# Patient Record
Sex: Female | Born: 1945
Health system: Southern US, Community
[De-identification: ages and names within clinical notes are randomized; demographics above are authoritative.]

## PROBLEM LIST (undated history)

## (undated) DIAGNOSIS — T7840XA Allergy, unspecified, initial encounter: Secondary | ICD-10-CM

## (undated) DIAGNOSIS — M1990 Unspecified osteoarthritis, unspecified site: Secondary | ICD-10-CM

## (undated) DIAGNOSIS — H409 Unspecified glaucoma: Secondary | ICD-10-CM

## (undated) HISTORY — PX: TONSILLECTOMY: SUR1361

## (undated) HISTORY — DX: Allergy, unspecified, initial encounter: T78.40XA

## (undated) HISTORY — DX: Unspecified osteoarthritis, unspecified site: M19.90

## (undated) HISTORY — DX: Unspecified glaucoma: H40.9

## (undated) HISTORY — PX: COSMETIC SURGERY: SHX468

---

## 1998-11-11 ENCOUNTER — Other Ambulatory Visit: Admission: RE | Admit: 1998-11-11 | Discharge: 1998-11-11 | Payer: Self-pay | Admitting: Gynecology

## 1999-09-27 ENCOUNTER — Other Ambulatory Visit: Admission: RE | Admit: 1999-09-27 | Discharge: 1999-09-27 | Payer: Self-pay | Admitting: Gynecology

## 1999-09-27 ENCOUNTER — Encounter (INDEPENDENT_AMBULATORY_CARE_PROVIDER_SITE_OTHER): Payer: Self-pay

## 1999-12-22 ENCOUNTER — Ambulatory Visit (HOSPITAL_COMMUNITY): Admission: RE | Admit: 1999-12-22 | Discharge: 1999-12-22 | Payer: Self-pay | Admitting: Gastroenterology

## 2000-01-18 ENCOUNTER — Encounter: Payer: Self-pay | Admitting: Gynecology

## 2000-01-18 ENCOUNTER — Encounter: Admission: RE | Admit: 2000-01-18 | Discharge: 2000-01-18 | Payer: Self-pay | Admitting: Gynecology

## 2001-02-05 ENCOUNTER — Encounter: Payer: Self-pay | Admitting: Gynecology

## 2001-02-05 ENCOUNTER — Encounter: Admission: RE | Admit: 2001-02-05 | Discharge: 2001-02-05 | Payer: Self-pay | Admitting: Gynecology

## 2002-11-06 ENCOUNTER — Encounter: Payer: Self-pay | Admitting: Family Medicine

## 2002-11-06 ENCOUNTER — Encounter: Admission: RE | Admit: 2002-11-06 | Discharge: 2002-11-06 | Payer: Self-pay | Admitting: Gynecology

## 2002-11-12 ENCOUNTER — Other Ambulatory Visit: Admission: RE | Admit: 2002-11-12 | Discharge: 2002-11-12 | Payer: Self-pay | Admitting: Family Medicine

## 2004-03-15 ENCOUNTER — Encounter: Admission: RE | Admit: 2004-03-15 | Discharge: 2004-03-15 | Payer: Self-pay | Admitting: Family Medicine

## 2004-12-16 ENCOUNTER — Encounter: Admission: RE | Admit: 2004-12-16 | Discharge: 2004-12-16 | Payer: Self-pay | Admitting: Family Medicine

## 2004-12-28 ENCOUNTER — Encounter: Admission: RE | Admit: 2004-12-28 | Discharge: 2004-12-28 | Payer: Self-pay | Admitting: Family Medicine

## 2005-05-02 ENCOUNTER — Encounter: Admission: RE | Admit: 2005-05-02 | Discharge: 2005-05-02 | Payer: Self-pay | Admitting: Family Medicine

## 2005-12-11 ENCOUNTER — Other Ambulatory Visit: Admission: RE | Admit: 2005-12-11 | Discharge: 2005-12-11 | Payer: Self-pay | Admitting: *Deleted

## 2006-05-03 ENCOUNTER — Encounter: Admission: RE | Admit: 2006-05-03 | Discharge: 2006-05-03 | Payer: Self-pay | Admitting: Family Medicine

## 2007-05-23 ENCOUNTER — Encounter: Admission: RE | Admit: 2007-05-23 | Discharge: 2007-05-23 | Payer: Self-pay | Admitting: Family Medicine

## 2007-07-29 ENCOUNTER — Other Ambulatory Visit: Admission: RE | Admit: 2007-07-29 | Discharge: 2007-07-29 | Payer: Self-pay | Admitting: Family Medicine

## 2007-11-12 ENCOUNTER — Encounter: Admission: RE | Admit: 2007-11-12 | Discharge: 2007-11-12 | Payer: Self-pay | Admitting: Family Medicine

## 2007-12-17 ENCOUNTER — Encounter: Admission: RE | Admit: 2007-12-17 | Discharge: 2007-12-17 | Payer: Self-pay | Admitting: Family Medicine

## 2007-12-17 ENCOUNTER — Encounter (INDEPENDENT_AMBULATORY_CARE_PROVIDER_SITE_OTHER): Payer: Self-pay | Admitting: Interventional Radiology

## 2007-12-17 ENCOUNTER — Other Ambulatory Visit: Admission: RE | Admit: 2007-12-17 | Discharge: 2007-12-17 | Payer: Self-pay | Admitting: Interventional Radiology

## 2008-01-03 ENCOUNTER — Ambulatory Visit: Payer: Self-pay | Admitting: Vascular Surgery

## 2008-06-02 ENCOUNTER — Encounter: Admission: RE | Admit: 2008-06-02 | Discharge: 2008-06-02 | Payer: Self-pay | Admitting: Family Medicine

## 2009-05-28 ENCOUNTER — Encounter: Admission: RE | Admit: 2009-05-28 | Discharge: 2009-05-28 | Payer: Self-pay | Admitting: Family Medicine

## 2009-06-01 ENCOUNTER — Ambulatory Visit (HOSPITAL_COMMUNITY): Admission: RE | Admit: 2009-06-01 | Discharge: 2009-06-01 | Payer: Self-pay | Admitting: Family Medicine

## 2009-06-18 ENCOUNTER — Encounter: Admission: RE | Admit: 2009-06-18 | Discharge: 2009-06-18 | Payer: Self-pay | Admitting: Family Medicine

## 2009-07-13 ENCOUNTER — Ambulatory Visit (HOSPITAL_BASED_OUTPATIENT_CLINIC_OR_DEPARTMENT_OTHER): Admission: RE | Admit: 2009-07-13 | Discharge: 2009-07-13 | Payer: Self-pay | Admitting: Gynecology

## 2009-12-08 ENCOUNTER — Emergency Department (HOSPITAL_COMMUNITY): Admission: EM | Admit: 2009-12-08 | Discharge: 2009-12-08 | Payer: Self-pay | Admitting: Emergency Medicine

## 2010-05-30 ENCOUNTER — Encounter: Payer: Self-pay | Admitting: Family Medicine

## 2010-07-15 ENCOUNTER — Other Ambulatory Visit: Payer: Self-pay | Admitting: Family Medicine

## 2010-07-15 DIAGNOSIS — Z1231 Encounter for screening mammogram for malignant neoplasm of breast: Secondary | ICD-10-CM

## 2010-07-25 ENCOUNTER — Ambulatory Visit
Admission: RE | Admit: 2010-07-25 | Discharge: 2010-07-25 | Disposition: A | Discharge status: Home / Self Care | Payer: 59 | Source: Ambulatory Visit | Attending: Family Medicine | Admitting: Family Medicine

## 2010-07-25 DIAGNOSIS — Z1231 Encounter for screening mammogram for malignant neoplasm of breast: Secondary | ICD-10-CM

## 2010-08-01 LAB — POCT HEMOGLOBIN-HEMACUE: Hemoglobin: 12.9 g/dL (ref 12.0–15.0)

## 2010-09-20 NOTE — Consult Note (Signed)
NEW PATIENT CONSULTATION   Maria Hurst, Maria Hurst  DOB:  Nov 27, 1945                                       01/03/2008  ZOXWR#:[id number]   The patient presents today for evaluation of extracranial  cerebrovascular occlusive disease.  She is a very pleasant 65 year old  white female who had an episode this year of severe headache and  tingling in her left arm.  She had undergone workup of this to include a  CAT scan of her head, which was negative, an echocardiogram, which  showed minimal stage I diastolic dysfunction and mild aortic sclerosis,  and also underwent carotid duplex, which shows no evidence of carotid  stenosis, but did show bilateral carotid plaque, left greater than  right.  She reports that she has had a significant amount of stress on  her job and resulting in a severe headache, and did report some tingling  around the same time, although now she does not relate it specifically  to a headache.  She denies any weakness, and simply reported a tingling  sensation in her arm, as though she had slept on it wrong.  She  specifically denies any prior amaurosis fugax, transient ischemic  attack, or stroke.   PAST HISTORY:  Significant for elevated blood pressure and elevated  cholesterol, which are controlled with medications.  She denies any  cardiac difficulty.  She is married.  She has 3 to 5 alcoholic drinks  per week.   REVIEW OF SYSTEMS:  Her weight is reported at 162 pounds.  She is 5 feet  4 inches tall.   MEDICATION ALLERGIES:  Sulfa.   CURRENT MEDICATIONS:  Simvastatin, timolol eye drops, baby aspirin, and  prednisone Dosepak for poison ivy on her right arm.   PHYSICAL EXAM:  Blood pressure is 142/88, pulse 69, respirations 18.  She is grossly intact neurologically.  Well-developed, well-nourished  white female in no acute distress.  Her carotid arteries are without  bruits bilaterally with normal pulsation.  She has 2+ radial and 2+  dorsalis  pedis pulses bilaterally.   I reviewed her carotid duplex.  I explained that this showed no stenosis  and plaque only in both carotid systems.  I explained that her left arm  symptoms do not sound typical of transient ischemic attack, and that  regardless, she has no significant plaque in her right carotid  bifurcation with more plaque in the left, which would not cause left-  sided symptoms.  I recommended observation only.  I would not repeat her  duplex unless she developed new symptoms.  She will see [place] again on an  as needed basis.   Larina Earthly, M.D.  Electronically Signed   TFE/MEDQ  D:  01/03/2008  T:  01/06/2008  Job:  [id number]   cc:   Dwana Curd. Para March, M.D.

## 2010-09-23 NOTE — Procedures (Signed)
[facility]  Patient:    Maria Hurst, Maria Hurst                      MRN: [id number] Proc. Date: 12/22/99 Adm. Date:  [id number] Disc. Date: [id number] Attending:  Rich Brave                           Procedure Report  PROCEDURE:  Colonoscopy with biopsies.  INDICATIONS FOR PROCEDURE:  A 65 year old female with intermittent diarrhea, for colon cancer screening.  FINDINGS:  Normal exam to the terminal ileum.  DESCRIPTION OF PROCEDURE:  The nature, purpose and risk of the procedure had been discussed with the patient who provided written consent. Sedation was fentanyl 100 mcg and Versed 10 mg IV prior to and during this procedure and the upper endoscopy which immediately preceded it.  Perianal inspection and digital exam to the rectum were unremarkable. The Olympus adult video colonoscope was easily advanced to the cecum and then with mild difficulty up into a normal appearing terminal ileum for a distance of about 10 or 15 cm. Pullback was then performed in a gradual fashion.  This was a normal examination. No cancer, polyps, masses, colitis, vascular malformations, or diverticular disease were appreciated. Because of the history of diarrhea, I did obtain random mucosal biopsies from the terminal ileum and from each section of the colon. One of these biopsies, in the transverse colonic area, bled rather freely and was associated with formation of the submucosal bled which was violatious in color.  This appeared to stop bleeding spontaneously following the biopsy and was watched for a few minutes until the blood flow had essentially completely stopped.  retroflexion in the rectum showed rather prominent internal hemorrhoids.  The patient tolerated the procedure well and there were no apparent complications.  IMPRESSION:  Normal colonoscopy without source of diarrhea endoscopically evident.  PLAN:  1. Await pathology on todays biopsies.  2. Consider a screening flexible sigmoidoscopy for colon cancer screening     in approximately another 5 years.  3. Follow-up in the office could be at the discretion of the patient     according to the histologic findings on todays biopsies. DD:  12/22/99 TD:  12/24/99 Job: [id number]

## 2010-09-23 NOTE — Procedures (Signed)
[facility]  Patient:    Maria Hurst, Maria Hurst                      MRN: [id number] Proc. Date: 12/22/99 Adm. Date:  [id number] Disc. Date: [id number] Attending:  Rich Brave CC:         Gretta Cool, M.D.

## 2010-11-14 ENCOUNTER — Other Ambulatory Visit: Payer: Self-pay | Admitting: Family Medicine

## 2010-11-14 ENCOUNTER — Other Ambulatory Visit (HOSPITAL_COMMUNITY)
Admission: RE | Admit: 2010-11-14 | Discharge: 2010-11-14 | Disposition: A | Discharge status: Home / Self Care | Payer: 59 | Source: Ambulatory Visit | Attending: Family Medicine | Admitting: Family Medicine

## 2010-11-14 DIAGNOSIS — Z124 Encounter for screening for malignant neoplasm of cervix: Secondary | ICD-10-CM | POA: Insufficient documentation

## 2010-11-14 DIAGNOSIS — Z1159 Encounter for screening for other viral diseases: Secondary | ICD-10-CM | POA: Insufficient documentation

## 2011-07-28 ENCOUNTER — Other Ambulatory Visit: Payer: Self-pay | Admitting: Family Medicine

## 2011-07-28 DIAGNOSIS — Z1231 Encounter for screening mammogram for malignant neoplasm of breast: Secondary | ICD-10-CM

## 2011-08-03 ENCOUNTER — Ambulatory Visit
Admission: RE | Admit: 2011-08-03 | Discharge: 2011-08-03 | Disposition: A | Discharge status: Home / Self Care | Payer: BC Managed Care – PPO | Source: Ambulatory Visit | Attending: Family Medicine | Admitting: Family Medicine

## 2011-08-03 DIAGNOSIS — Z1231 Encounter for screening mammogram for malignant neoplasm of breast: Secondary | ICD-10-CM

## 2011-12-19 IMAGING — CT CT ABD-PELV W/ CM
2 of 5 series · 17 of 46 positions shown, 19 images · IV contrast (agent unspecified)
Comparison: Ultrasound abdomen of 12/16/2004

CLINICAL DATA: Left lower quadrant pain, some nausea, question of
palpable mass for several weeks

CT ABDOMEN AND PELVIS WITH CONTRAST
TECHNIQUE: Multidetector CT imaging of the abdomen and pelvis was
performed following the standard protocol during bolus
administration of intravenous contrast.
Contrast: 100 ml [id number]

[Series 2: portal · axial · portal-venous · 0.72mm/px · z∈[+548,+953]mm · 14 of 93 slices shown, 16 images]
[im 6/93  soft-tissue]
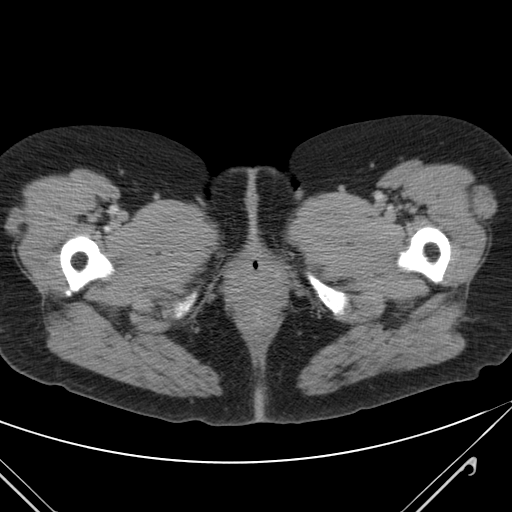
[im 6/93  bone]
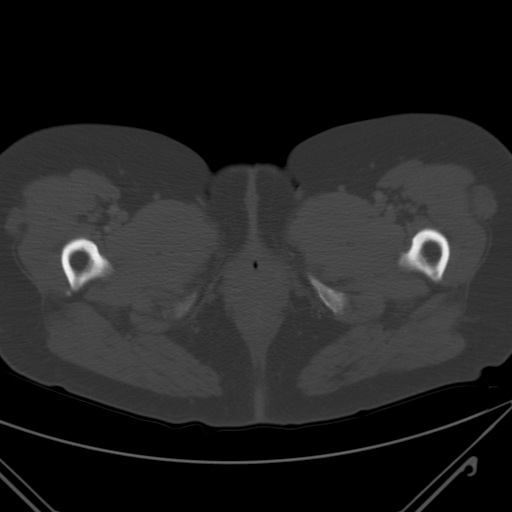
[im 11/93  soft-tissue]
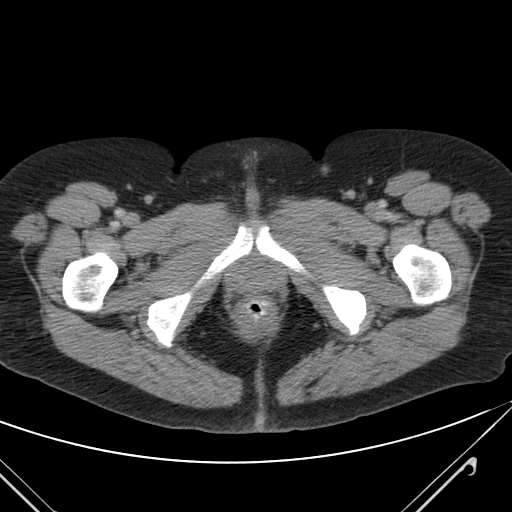
[im 17/93  soft-tissue]
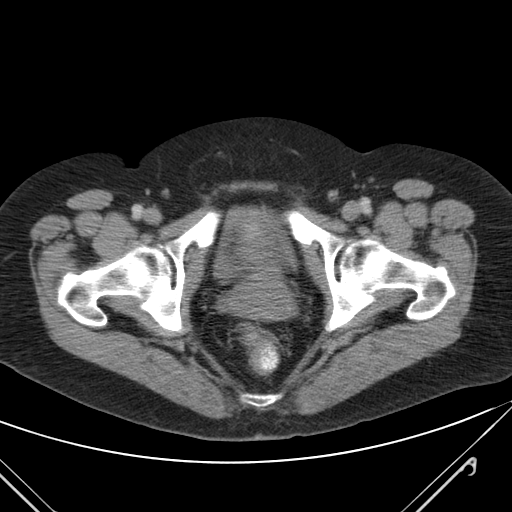
[im 28/93  soft-tissue]
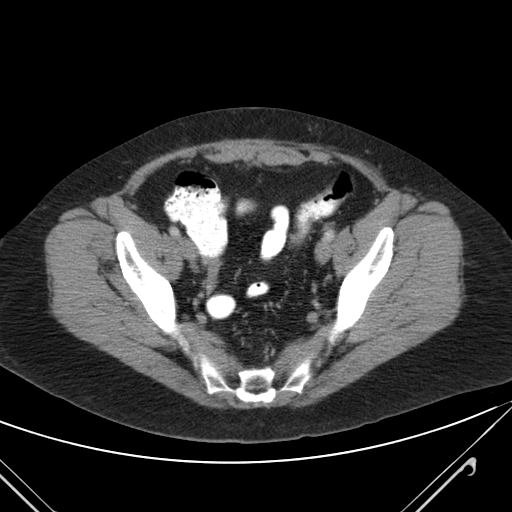
[im 33/93  soft-tissue]
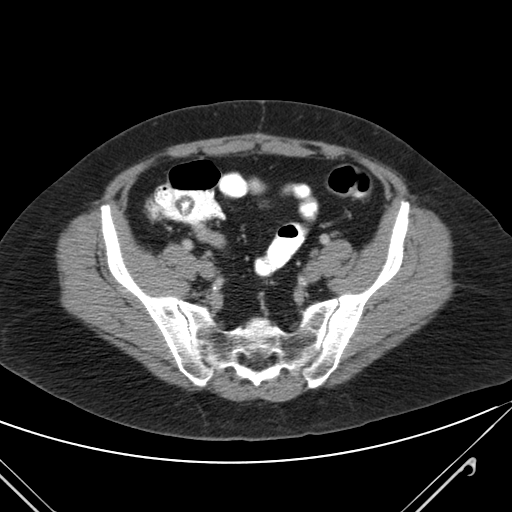
[im 38/93  soft-tissue]
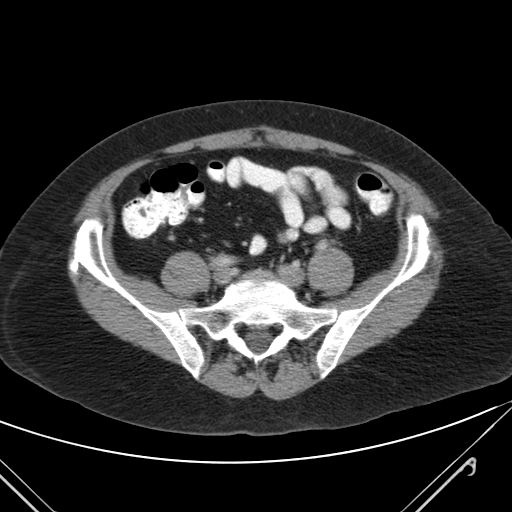
[im 44/93  soft-tissue]
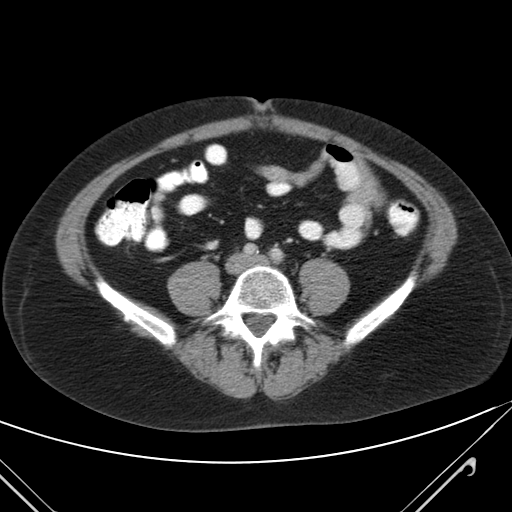
[im 49/93  soft-tissue]
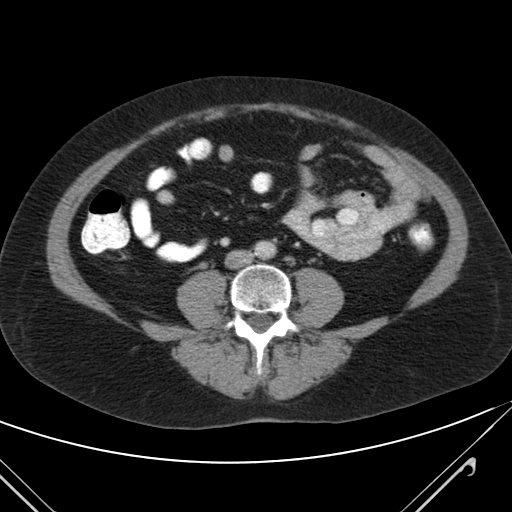
[im 55/93  soft-tissue]
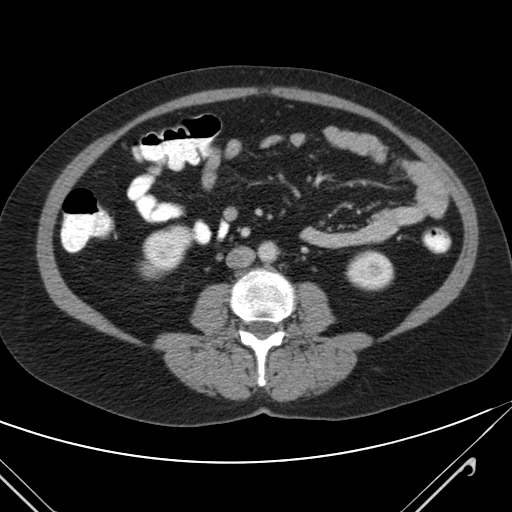
[im 55/93  bone]
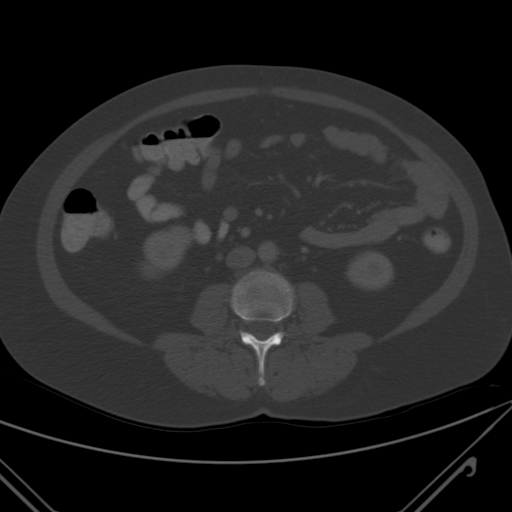
[im 60/93  soft-tissue]
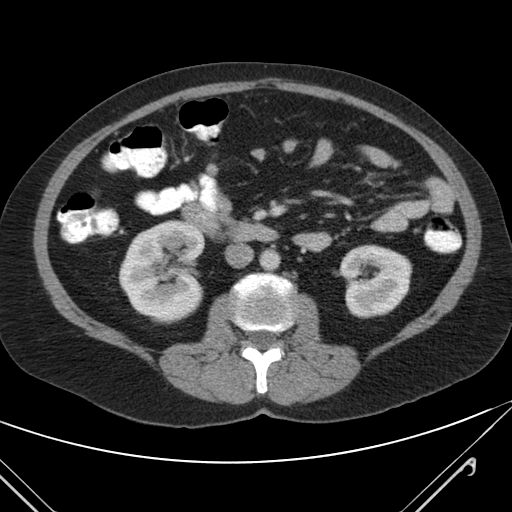
[im 71/93  soft-tissue]
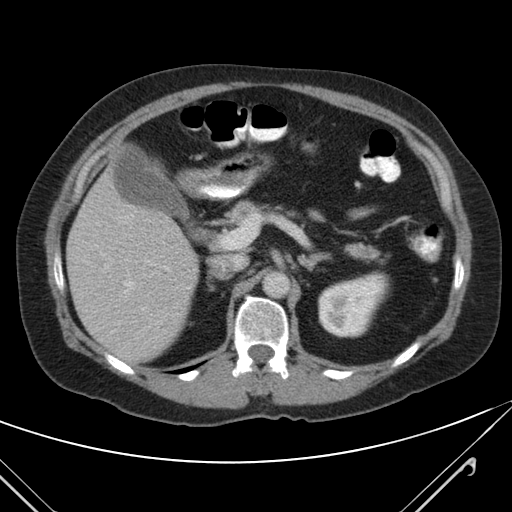
[im 76/93  soft-tissue]
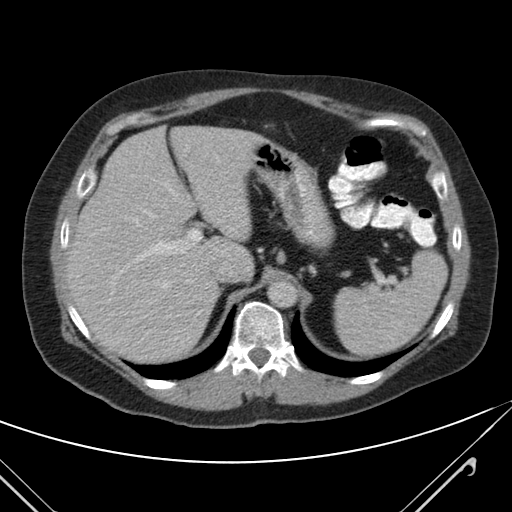
[im 82/93  soft-tissue]
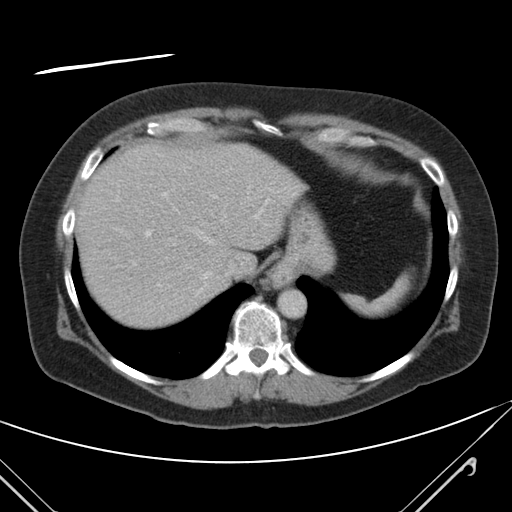
[im 87/93  soft-tissue]
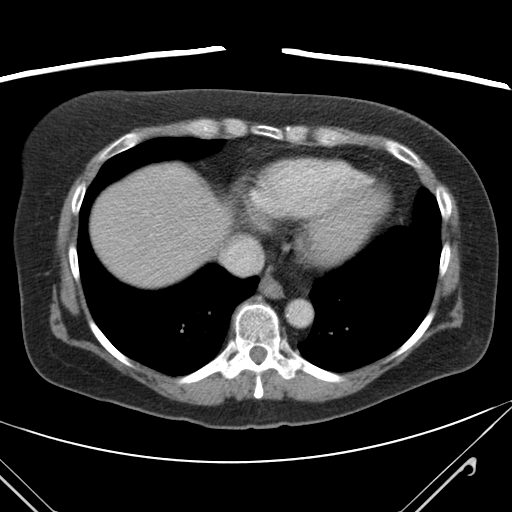

[Series 8040: coronals · coronal · 0.90mm/px · 3 of 81 slices shown]
[im 27/81  soft-tissue]
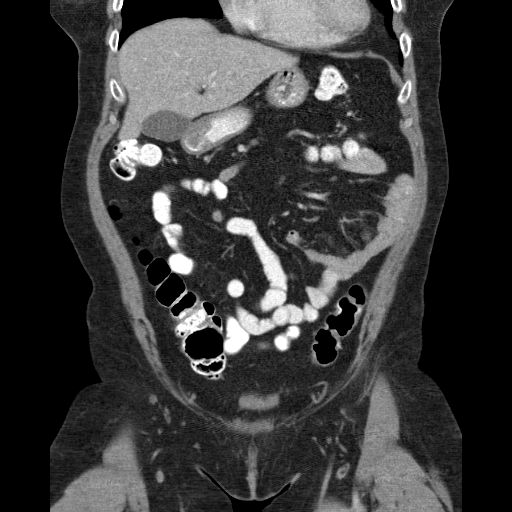
[im 36/81  soft-tissue]
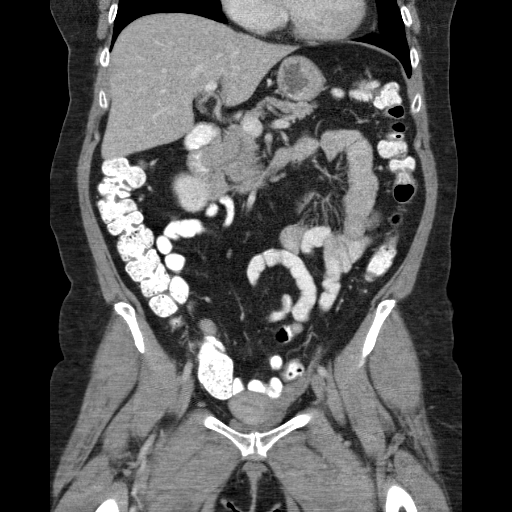
[im 45/81  soft-tissue]
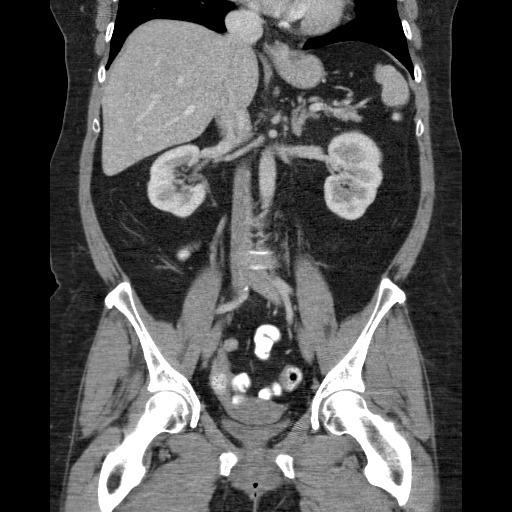

[17 of 46 positions shown; findings below may reference images not displayed]

FINDINGS: The lung bases are clear.  There does appear to be a
small hiatal hernia present.  The liver enhances with no focal
abnormality and no ductal dilatation is seen.  No calcified
gallstones are noted.  The pancreas is normal in size and the
pancreatic duct is not dilated.  The adrenal glands and spleen are
unremarkable.  The stomach is not well distended.  The kidneys
enhance with no calculus or mass and no hydronephrosis is seen.
The abdominal aorta is normal in caliber.  No adenopathy is seen.

The uterus is normal in size.  The urinary bladder is not well
distended and cannot be evaluated.  The left ovary appears to be
anteriorly positioned and contains a rounded low attenuation
structure consistent with a slightly complex left ovarian cyst of
approximately 25 x 22 mm.  If this poor pole area persists then
follow-up ultrasound may be warranted to assess internal
characteristics of this lesion.  The right ovary is normal in size.
No free fluid is seen.  There is no evidence of diverticulitis.
The terminal ileum appears normal.  A few small lymph nodes are
present throughout the lower abdomen of doubtful significance. No
bony abnormality is seen.
IMPRESSION: 1.  The left ovary appears to be anteriorly positioned and appears
to contain a probable mildly complex left ovarian cyst of 25 x 22
mm.  This may represent the palpable abnormality on physical exam.
Consider follow-up by ultrasound if warranted clinically.
2.  No diverticulitis.
3.  Probable small hiatal hernia.

## 2012-07-16 ENCOUNTER — Other Ambulatory Visit: Payer: Self-pay

## 2012-07-16 DIAGNOSIS — Z1231 Encounter for screening mammogram for malignant neoplasm of breast: Secondary | ICD-10-CM

## 2012-08-09 ENCOUNTER — Ambulatory Visit
Admission: RE | Admit: 2012-08-09 | Discharge: 2012-08-09 | Disposition: A | Discharge status: Home / Self Care | Payer: Self-pay | Source: Ambulatory Visit

## 2012-08-09 DIAGNOSIS — Z1231 Encounter for screening mammogram for malignant neoplasm of breast: Secondary | ICD-10-CM

## 2013-01-15 ENCOUNTER — Other Ambulatory Visit: Payer: Self-pay | Admitting: Dermatology

## 2013-10-14 ENCOUNTER — Other Ambulatory Visit: Payer: Self-pay

## 2013-10-14 DIAGNOSIS — Z1231 Encounter for screening mammogram for malignant neoplasm of breast: Secondary | ICD-10-CM

## 2013-10-28 ENCOUNTER — Ambulatory Visit
Admission: RE | Admit: 2013-10-28 | Discharge: 2013-10-28 | Disposition: A | Discharge status: Home / Self Care | Payer: Medicare Other | Source: Ambulatory Visit

## 2013-10-28 ENCOUNTER — Encounter (INDEPENDENT_AMBULATORY_CARE_PROVIDER_SITE_OTHER): Payer: Self-pay

## 2013-10-28 DIAGNOSIS — Z1231 Encounter for screening mammogram for malignant neoplasm of breast: Secondary | ICD-10-CM

## 2014-04-03 ENCOUNTER — Ambulatory Visit (INDEPENDENT_AMBULATORY_CARE_PROVIDER_SITE_OTHER): Payer: Medicare Other

## 2014-04-03 ENCOUNTER — Ambulatory Visit (INDEPENDENT_AMBULATORY_CARE_PROVIDER_SITE_OTHER): Payer: Medicare Other | Admitting: Internal Medicine

## 2014-04-03 VITALS — BP 124/76 | HR 82 | Temp 97.5°F | Resp 16 | Ht 64.0 in | Wt 172.6 lb

## 2014-04-03 DIAGNOSIS — R509 Fever, unspecified: Secondary | ICD-10-CM

## 2014-04-03 DIAGNOSIS — R059 Cough, unspecified: Secondary | ICD-10-CM

## 2014-04-03 DIAGNOSIS — R05 Cough: Secondary | ICD-10-CM

## 2014-04-03 DIAGNOSIS — I10 Essential (primary) hypertension: Secondary | ICD-10-CM

## 2014-04-03 MED ORDER — AMOXICILLIN 500 MG PO CAPS: 1000.0000 mg | ORAL_CAPSULE | Freq: Two times a day (BID) | ORAL | Status: AC

## 2014-04-03 MED ORDER — HYDROCODONE-HOMATROPINE 5-1.5 MG/5ML PO SYRP: 5.0000 mL | ORAL_SOLUTION | Freq: Four times a day (QID) | ORAL | Status: DC | PRN

## 2014-04-03 MED ORDER — PREDNISONE 20 MG PO TABS: ORAL_TABLET | ORAL | Status: DC

## 2014-04-03 NOTE — Progress Notes (Signed)
   Subjective:    Patient ID: Maria Hurst, female    DOB: Feb 18, 1946, 68 y.o.   MRN: [id number] This chart was scribed for Tami Lin, MD by Girtha Hake, ED Scribe. The patient was seen in Room 4. The patient's care was started at 10:23 AM.    HPI HPI Comments: Maria Hurst is a 68 y.o. female who presents today complaining of fever, chills, mild congestion, and cough beginning four days ago.  Patient states that the cough is unproductive.  She reports associated wheezing.  Patient reports that the cough is exacerbated by lying flat while trying to sleep.  She reports that she has experienced similar symptoms once before.  She has a history of pneumonia.  She reports that she received both pneumonia shots.    Patient does not have a PCP.      Review of Systems  Constitutional: Positive for fever and chills.  HENT: Positive for congestion.   Respiratory: Positive for cough and wheezing.   no hx asthma     Objective:   Physical Exam  HENT:  Right Ear: External ear normal.  Left Ear: External ear normal.  Mouth/Throat: Oropharynx is clear and moist.  Prurulent drainage bilaterally.  Tender left maxillary area to percussion.  Neck:  No lymph nodes.  Pulmonary/Chest: She has wheezes. She has rales.  Rales at the left base.  Wheezing on forced expiration bilaterally.  Lymphadenopathy:    She has no cervical adenopathy.  Nursing note and vitals reviewed.  [facility] reading (PRIMARY) by  Dr. Eirene Rather=NAD         Assessment & Plan:   I have completed the patient encounter in its entirety as documented by the scribe, with editing by me where necessary. Cathy Ropp P. Laney Pastor, M.D.  Acute sinusitis  Cough - due to RAD  Fever,   Meds ordered this encounter  Medications  . amoxicillin (AMOXIL) 500 MG capsule    Sig: Take 2 capsules (1,000 mg total) by mouth 2 (two) times daily.    Dispense:  40 capsule    Refill:  0  . HYDROcodone-homatropine (HYCODAN)  5-1.5 MG/5ML syrup    Sig: Take 5 mLs by mouth every 6 (six) hours as needed.    Dispense:  120 mL    Refill:  0  . predniSONE (DELTASONE) 20 MG tablet    Sig: 3 tabs a day for 3 days    Dispense:  9 tablet    Refill:  0

## 2014-10-02 ENCOUNTER — Other Ambulatory Visit: Payer: Self-pay

## 2014-10-02 DIAGNOSIS — Z1231 Encounter for screening mammogram for malignant neoplasm of breast: Secondary | ICD-10-CM

## 2014-11-03 ENCOUNTER — Ambulatory Visit
Admission: RE | Admit: 2014-11-03 | Discharge: 2014-11-03 | Disposition: A | Discharge status: Home / Self Care | Payer: PPO | Source: Ambulatory Visit

## 2014-11-03 DIAGNOSIS — Z1231 Encounter for screening mammogram for malignant neoplasm of breast: Secondary | ICD-10-CM

## 2015-05-21 DIAGNOSIS — R0982 Postnasal drip: Secondary | ICD-10-CM | POA: Diagnosis not present

## 2015-05-21 DIAGNOSIS — J301 Allergic rhinitis due to pollen: Secondary | ICD-10-CM | POA: Diagnosis not present

## 2015-05-21 DIAGNOSIS — R51 Headache: Secondary | ICD-10-CM | POA: Diagnosis not present

## 2015-05-21 DIAGNOSIS — Z8709 Personal history of other diseases of the respiratory system: Secondary | ICD-10-CM | POA: Diagnosis not present

## 2015-06-01 DIAGNOSIS — R51 Headache: Secondary | ICD-10-CM | POA: Diagnosis not present

## 2015-07-09 DIAGNOSIS — E559 Vitamin D deficiency, unspecified: Secondary | ICD-10-CM | POA: Diagnosis not present

## 2015-07-09 DIAGNOSIS — Z1211 Encounter for screening for malignant neoplasm of colon: Secondary | ICD-10-CM | POA: Diagnosis not present

## 2015-07-09 DIAGNOSIS — M8588 Other specified disorders of bone density and structure, other site: Secondary | ICD-10-CM | POA: Diagnosis not present

## 2015-07-09 DIAGNOSIS — G4700 Insomnia, unspecified: Secondary | ICD-10-CM | POA: Diagnosis not present

## 2015-07-09 DIAGNOSIS — R7301 Impaired fasting glucose: Secondary | ICD-10-CM | POA: Diagnosis not present

## 2015-07-09 DIAGNOSIS — E782 Mixed hyperlipidemia: Secondary | ICD-10-CM | POA: Diagnosis not present

## 2015-08-09 DIAGNOSIS — D225 Melanocytic nevi of trunk: Secondary | ICD-10-CM | POA: Diagnosis not present

## 2015-08-09 DIAGNOSIS — D2261 Melanocytic nevi of right upper limb, including shoulder: Secondary | ICD-10-CM | POA: Diagnosis not present

## 2015-08-09 DIAGNOSIS — L821 Other seborrheic keratosis: Secondary | ICD-10-CM | POA: Diagnosis not present

## 2015-08-09 DIAGNOSIS — D2262 Melanocytic nevi of left upper limb, including shoulder: Secondary | ICD-10-CM | POA: Diagnosis not present

## 2015-08-09 DIAGNOSIS — D2271 Melanocytic nevi of right lower limb, including hip: Secondary | ICD-10-CM | POA: Diagnosis not present

## 2015-08-09 DIAGNOSIS — D2272 Melanocytic nevi of left lower limb, including hip: Secondary | ICD-10-CM | POA: Diagnosis not present

## 2015-08-09 DIAGNOSIS — D2361 Other benign neoplasm of skin of right upper limb, including shoulder: Secondary | ICD-10-CM | POA: Diagnosis not present

## 2015-08-09 DIAGNOSIS — L570 Actinic keratosis: Secondary | ICD-10-CM | POA: Diagnosis not present

## 2015-09-07 DIAGNOSIS — R194 Change in bowel habit: Secondary | ICD-10-CM | POA: Diagnosis not present

## 2015-11-10 DIAGNOSIS — D229 Melanocytic nevi, unspecified: Secondary | ICD-10-CM | POA: Diagnosis not present

## 2015-11-10 DIAGNOSIS — L918 Other hypertrophic disorders of the skin: Secondary | ICD-10-CM | POA: Diagnosis not present

## 2015-11-23 ENCOUNTER — Other Ambulatory Visit: Payer: Self-pay | Admitting: Family Medicine

## 2015-11-23 DIAGNOSIS — Z1231 Encounter for screening mammogram for malignant neoplasm of breast: Secondary | ICD-10-CM

## 2015-11-25 ENCOUNTER — Ambulatory Visit
Admission: RE | Admit: 2015-11-25 | Discharge: 2015-11-25 | Disposition: A | Discharge status: Home / Self Care | Payer: PPO | Source: Ambulatory Visit | Attending: Family Medicine | Admitting: Family Medicine

## 2015-11-25 DIAGNOSIS — Z1231 Encounter for screening mammogram for malignant neoplasm of breast: Secondary | ICD-10-CM

## 2015-12-16 DIAGNOSIS — H40013 Open angle with borderline findings, low risk, bilateral: Secondary | ICD-10-CM | POA: Diagnosis not present

## 2015-12-16 DIAGNOSIS — H31092 Other chorioretinal scars, left eye: Secondary | ICD-10-CM | POA: Diagnosis not present

## 2015-12-16 DIAGNOSIS — H43822 Vitreomacular adhesion, left eye: Secondary | ICD-10-CM | POA: Diagnosis not present

## 2015-12-16 DIAGNOSIS — H2513 Age-related nuclear cataract, bilateral: Secondary | ICD-10-CM | POA: Diagnosis not present

## 2015-12-31 DIAGNOSIS — G4700 Insomnia, unspecified: Secondary | ICD-10-CM | POA: Diagnosis not present

## 2015-12-31 DIAGNOSIS — H4050X0 Glaucoma secondary to other eye disorders, unspecified eye, stage unspecified: Secondary | ICD-10-CM | POA: Diagnosis not present

## 2015-12-31 DIAGNOSIS — N951 Menopausal and female climacteric states: Secondary | ICD-10-CM | POA: Diagnosis not present

## 2015-12-31 DIAGNOSIS — M8588 Other specified disorders of bone density and structure, other site: Secondary | ICD-10-CM | POA: Diagnosis not present

## 2015-12-31 DIAGNOSIS — Z23 Encounter for immunization: Secondary | ICD-10-CM | POA: Diagnosis not present

## 2015-12-31 DIAGNOSIS — R7301 Impaired fasting glucose: Secondary | ICD-10-CM | POA: Diagnosis not present

## 2015-12-31 DIAGNOSIS — E559 Vitamin D deficiency, unspecified: Secondary | ICD-10-CM | POA: Diagnosis not present

## 2015-12-31 DIAGNOSIS — K582 Mixed irritable bowel syndrome: Secondary | ICD-10-CM | POA: Diagnosis not present

## 2015-12-31 DIAGNOSIS — E782 Mixed hyperlipidemia: Secondary | ICD-10-CM | POA: Diagnosis not present

## 2016-01-03 DIAGNOSIS — K582 Mixed irritable bowel syndrome: Secondary | ICD-10-CM | POA: Diagnosis not present

## 2016-01-27 DIAGNOSIS — J0140 Acute pansinusitis, unspecified: Secondary | ICD-10-CM | POA: Diagnosis not present

## 2016-02-15 DIAGNOSIS — H2513 Age-related nuclear cataract, bilateral: Secondary | ICD-10-CM | POA: Diagnosis not present

## 2016-02-15 DIAGNOSIS — H40013 Open angle with borderline findings, low risk, bilateral: Secondary | ICD-10-CM | POA: Diagnosis not present

## 2016-02-15 DIAGNOSIS — H43822 Vitreomacular adhesion, left eye: Secondary | ICD-10-CM | POA: Diagnosis not present

## 2016-06-09 DIAGNOSIS — R197 Diarrhea, unspecified: Secondary | ICD-10-CM | POA: Diagnosis not present

## 2016-06-13 DIAGNOSIS — R197 Diarrhea, unspecified: Secondary | ICD-10-CM | POA: Diagnosis not present

## 2016-09-01 DIAGNOSIS — J209 Acute bronchitis, unspecified: Secondary | ICD-10-CM | POA: Diagnosis not present

## 2016-11-15 ENCOUNTER — Other Ambulatory Visit (HOSPITAL_COMMUNITY)
Admission: RE | Admit: 2016-11-15 | Discharge: 2016-11-15 | Disposition: A | Discharge status: Home / Self Care | Payer: PPO | Source: Ambulatory Visit | Attending: Family Medicine | Admitting: Family Medicine

## 2016-11-15 ENCOUNTER — Other Ambulatory Visit: Payer: Self-pay | Admitting: Family Medicine

## 2016-11-15 DIAGNOSIS — E559 Vitamin D deficiency, unspecified: Secondary | ICD-10-CM | POA: Diagnosis not present

## 2016-11-15 DIAGNOSIS — Z124 Encounter for screening for malignant neoplasm of cervix: Secondary | ICD-10-CM | POA: Insufficient documentation

## 2016-11-15 DIAGNOSIS — Z7189 Other specified counseling: Secondary | ICD-10-CM | POA: Diagnosis not present

## 2016-11-15 DIAGNOSIS — E782 Mixed hyperlipidemia: Secondary | ICD-10-CM | POA: Diagnosis not present

## 2016-11-15 DIAGNOSIS — N898 Other specified noninflammatory disorders of vagina: Secondary | ICD-10-CM | POA: Diagnosis not present

## 2016-11-15 DIAGNOSIS — K219 Gastro-esophageal reflux disease without esophagitis: Secondary | ICD-10-CM | POA: Diagnosis not present

## 2016-11-15 DIAGNOSIS — R7301 Impaired fasting glucose: Secondary | ICD-10-CM | POA: Diagnosis not present

## 2016-11-15 DIAGNOSIS — Z01419 Encounter for gynecological examination (general) (routine) without abnormal findings: Secondary | ICD-10-CM | POA: Diagnosis not present

## 2016-11-15 DIAGNOSIS — G4700 Insomnia, unspecified: Secondary | ICD-10-CM | POA: Diagnosis not present

## 2016-11-15 DIAGNOSIS — M8588 Other specified disorders of bone density and structure, other site: Secondary | ICD-10-CM | POA: Diagnosis not present

## 2016-11-15 DIAGNOSIS — Z0000 Encounter for general adult medical examination without abnormal findings: Secondary | ICD-10-CM | POA: Diagnosis not present

## 2016-11-20 LAB — CYTOLOGY - PAP
Adequacy: ABSENT
Diagnosis: NEGATIVE

## 2016-12-26 DIAGNOSIS — M8588 Other specified disorders of bone density and structure, other site: Secondary | ICD-10-CM | POA: Diagnosis not present

## 2017-01-25 DIAGNOSIS — L821 Other seborrheic keratosis: Secondary | ICD-10-CM | POA: Diagnosis not present

## 2017-01-25 DIAGNOSIS — D225 Melanocytic nevi of trunk: Secondary | ICD-10-CM | POA: Diagnosis not present

## 2017-01-25 DIAGNOSIS — D2272 Melanocytic nevi of left lower limb, including hip: Secondary | ICD-10-CM | POA: Diagnosis not present

## 2017-01-25 DIAGNOSIS — D2271 Melanocytic nevi of right lower limb, including hip: Secondary | ICD-10-CM | POA: Diagnosis not present

## 2017-01-25 DIAGNOSIS — B360 Pityriasis versicolor: Secondary | ICD-10-CM | POA: Diagnosis not present

## 2017-01-25 DIAGNOSIS — D2372 Other benign neoplasm of skin of left lower limb, including hip: Secondary | ICD-10-CM | POA: Diagnosis not present

## 2017-05-21 DIAGNOSIS — J069 Acute upper respiratory infection, unspecified: Secondary | ICD-10-CM | POA: Diagnosis not present

## 2017-05-21 DIAGNOSIS — R52 Pain, unspecified: Secondary | ICD-10-CM | POA: Diagnosis not present

## 2017-05-24 DIAGNOSIS — R05 Cough: Secondary | ICD-10-CM | POA: Diagnosis not present

## 2017-05-24 DIAGNOSIS — R042 Hemoptysis: Secondary | ICD-10-CM | POA: Diagnosis not present

## 2017-05-28 DIAGNOSIS — R200 Anesthesia of skin: Secondary | ICD-10-CM | POA: Diagnosis not present

## 2017-05-28 DIAGNOSIS — I517 Cardiomegaly: Secondary | ICD-10-CM | POA: Diagnosis not present

## 2017-05-28 DIAGNOSIS — M79602 Pain in left arm: Secondary | ICD-10-CM | POA: Diagnosis not present

## 2017-06-07 DIAGNOSIS — E7800 Pure hypercholesterolemia, unspecified: Secondary | ICD-10-CM | POA: Diagnosis not present

## 2017-06-07 DIAGNOSIS — I517 Cardiomegaly: Secondary | ICD-10-CM | POA: Diagnosis not present

## 2017-06-07 DIAGNOSIS — Z0189 Encounter for other specified special examinations: Secondary | ICD-10-CM | POA: Diagnosis not present

## 2017-06-08 DIAGNOSIS — I517 Cardiomegaly: Secondary | ICD-10-CM | POA: Diagnosis not present

## 2017-07-26 DIAGNOSIS — J0101 Acute recurrent maxillary sinusitis: Secondary | ICD-10-CM | POA: Diagnosis not present

## 2017-08-06 DIAGNOSIS — H18463 Peripheral corneal degeneration, bilateral: Secondary | ICD-10-CM | POA: Diagnosis not present

## 2017-08-06 DIAGNOSIS — H10503 Unspecified blepharoconjunctivitis, bilateral: Secondary | ICD-10-CM | POA: Diagnosis not present

## 2017-08-15 DIAGNOSIS — H18463 Peripheral corneal degeneration, bilateral: Secondary | ICD-10-CM | POA: Diagnosis not present

## 2017-08-15 DIAGNOSIS — H10503 Unspecified blepharoconjunctivitis, bilateral: Secondary | ICD-10-CM | POA: Diagnosis not present

## 2017-08-15 DIAGNOSIS — H2513 Age-related nuclear cataract, bilateral: Secondary | ICD-10-CM | POA: Diagnosis not present

## 2017-10-03 DIAGNOSIS — H2513 Age-related nuclear cataract, bilateral: Secondary | ICD-10-CM | POA: Diagnosis not present

## 2017-10-03 DIAGNOSIS — H10503 Unspecified blepharoconjunctivitis, bilateral: Secondary | ICD-10-CM | POA: Diagnosis not present

## 2017-10-03 DIAGNOSIS — H18463 Peripheral corneal degeneration, bilateral: Secondary | ICD-10-CM | POA: Diagnosis not present

## 2017-10-16 DIAGNOSIS — B360 Pityriasis versicolor: Secondary | ICD-10-CM | POA: Diagnosis not present

## 2017-10-16 DIAGNOSIS — D2271 Melanocytic nevi of right lower limb, including hip: Secondary | ICD-10-CM | POA: Diagnosis not present

## 2017-10-16 DIAGNOSIS — D2272 Melanocytic nevi of left lower limb, including hip: Secondary | ICD-10-CM | POA: Diagnosis not present

## 2017-10-16 DIAGNOSIS — L821 Other seborrheic keratosis: Secondary | ICD-10-CM | POA: Diagnosis not present

## 2017-10-16 DIAGNOSIS — D485 Neoplasm of uncertain behavior of skin: Secondary | ICD-10-CM | POA: Diagnosis not present

## 2017-10-16 DIAGNOSIS — D2262 Melanocytic nevi of left upper limb, including shoulder: Secondary | ICD-10-CM | POA: Diagnosis not present

## 2017-10-16 DIAGNOSIS — L570 Actinic keratosis: Secondary | ICD-10-CM | POA: Diagnosis not present

## 2017-10-19 DIAGNOSIS — N950 Postmenopausal bleeding: Secondary | ICD-10-CM | POA: Diagnosis not present

## 2017-10-19 DIAGNOSIS — R1032 Left lower quadrant pain: Secondary | ICD-10-CM | POA: Diagnosis not present

## 2017-10-23 ENCOUNTER — Other Ambulatory Visit: Payer: Self-pay | Admitting: Family Medicine

## 2017-10-23 ENCOUNTER — Ambulatory Visit
Admission: RE | Admit: 2017-10-23 | Discharge: 2017-10-23 | Disposition: A | Discharge status: Home / Self Care | Payer: PPO | Source: Ambulatory Visit | Attending: Family Medicine | Admitting: Family Medicine

## 2017-10-23 DIAGNOSIS — R1032 Left lower quadrant pain: Secondary | ICD-10-CM

## 2017-10-23 DIAGNOSIS — K449 Diaphragmatic hernia without obstruction or gangrene: Secondary | ICD-10-CM | POA: Diagnosis not present

## 2017-10-23 MED ORDER — IOPAMIDOL (ISOVUE-300) INJECTION 61%
100.0000 mL | Freq: Once | INTRAVENOUS | Status: AC | PRN
  Administered 2017-10-23: 100 mL via INTRAVENOUS

## 2017-10-29 DIAGNOSIS — H10503 Unspecified blepharoconjunctivitis, bilateral: Secondary | ICD-10-CM | POA: Diagnosis not present

## 2017-10-29 DIAGNOSIS — H18463 Peripheral corneal degeneration, bilateral: Secondary | ICD-10-CM | POA: Diagnosis not present

## 2017-12-04 DIAGNOSIS — H40053 Ocular hypertension, bilateral: Secondary | ICD-10-CM | POA: Diagnosis not present

## 2017-12-04 DIAGNOSIS — H10503 Unspecified blepharoconjunctivitis, bilateral: Secondary | ICD-10-CM | POA: Diagnosis not present

## 2017-12-04 DIAGNOSIS — H18463 Peripheral corneal degeneration, bilateral: Secondary | ICD-10-CM | POA: Diagnosis not present

## 2017-12-25 DIAGNOSIS — B373 Candidiasis of vulva and vagina: Secondary | ICD-10-CM | POA: Diagnosis not present

## 2017-12-25 DIAGNOSIS — N952 Postmenopausal atrophic vaginitis: Secondary | ICD-10-CM | POA: Diagnosis not present

## 2017-12-27 ENCOUNTER — Other Ambulatory Visit: Payer: Self-pay | Admitting: Family Medicine

## 2017-12-27 DIAGNOSIS — N952 Postmenopausal atrophic vaginitis: Secondary | ICD-10-CM

## 2018-01-11 ENCOUNTER — Ambulatory Visit
Admission: RE | Admit: 2018-01-11 | Discharge: 2018-01-11 | Disposition: A | Discharge status: Home / Self Care | Payer: PPO | Source: Ambulatory Visit | Attending: Family Medicine | Admitting: Family Medicine

## 2018-01-11 DIAGNOSIS — N939 Abnormal uterine and vaginal bleeding, unspecified: Secondary | ICD-10-CM | POA: Diagnosis not present

## 2018-01-11 DIAGNOSIS — N952 Postmenopausal atrophic vaginitis: Secondary | ICD-10-CM

## 2018-01-16 DIAGNOSIS — H18463 Peripheral corneal degeneration, bilateral: Secondary | ICD-10-CM | POA: Diagnosis not present

## 2018-01-16 DIAGNOSIS — H10503 Unspecified blepharoconjunctivitis, bilateral: Secondary | ICD-10-CM | POA: Diagnosis not present

## 2018-01-18 ENCOUNTER — Other Ambulatory Visit: Payer: Self-pay | Admitting: Family Medicine

## 2018-01-18 DIAGNOSIS — N950 Postmenopausal bleeding: Secondary | ICD-10-CM | POA: Diagnosis not present

## 2018-02-18 DIAGNOSIS — Z124 Encounter for screening for malignant neoplasm of cervix: Secondary | ICD-10-CM | POA: Diagnosis not present

## 2018-02-18 DIAGNOSIS — N950 Postmenopausal bleeding: Secondary | ICD-10-CM | POA: Diagnosis not present

## 2018-02-18 DIAGNOSIS — Z6830 Body mass index (BMI) 30.0-30.9, adult: Secondary | ICD-10-CM | POA: Diagnosis not present

## 2018-02-25 ENCOUNTER — Other Ambulatory Visit: Payer: Self-pay | Admitting: Obstetrics and Gynecology

## 2018-02-27 DIAGNOSIS — H9190 Unspecified hearing loss, unspecified ear: Secondary | ICD-10-CM | POA: Diagnosis not present

## 2018-02-27 DIAGNOSIS — K219 Gastro-esophageal reflux disease without esophagitis: Secondary | ICD-10-CM | POA: Diagnosis not present

## 2018-02-27 DIAGNOSIS — N952 Postmenopausal atrophic vaginitis: Secondary | ICD-10-CM | POA: Diagnosis not present

## 2018-02-27 DIAGNOSIS — M1990 Unspecified osteoarthritis, unspecified site: Secondary | ICD-10-CM | POA: Diagnosis not present

## 2018-02-27 DIAGNOSIS — E559 Vitamin D deficiency, unspecified: Secondary | ICD-10-CM | POA: Diagnosis not present

## 2018-02-27 DIAGNOSIS — E782 Mixed hyperlipidemia: Secondary | ICD-10-CM | POA: Diagnosis not present

## 2018-02-27 DIAGNOSIS — Z0000 Encounter for general adult medical examination without abnormal findings: Secondary | ICD-10-CM | POA: Diagnosis not present

## 2018-02-27 DIAGNOSIS — R7301 Impaired fasting glucose: Secondary | ICD-10-CM | POA: Diagnosis not present

## 2018-02-27 DIAGNOSIS — H4050X0 Glaucoma secondary to other eye disorders, unspecified eye, stage unspecified: Secondary | ICD-10-CM | POA: Diagnosis not present

## 2018-02-27 DIAGNOSIS — M8588 Other specified disorders of bone density and structure, other site: Secondary | ICD-10-CM | POA: Diagnosis not present

## 2018-02-27 DIAGNOSIS — R030 Elevated blood-pressure reading, without diagnosis of hypertension: Secondary | ICD-10-CM | POA: Diagnosis not present

## 2018-02-27 DIAGNOSIS — H6123 Impacted cerumen, bilateral: Secondary | ICD-10-CM | POA: Diagnosis not present

## 2018-02-28 ENCOUNTER — Other Ambulatory Visit: Payer: Self-pay

## 2018-02-28 ENCOUNTER — Encounter (HOSPITAL_BASED_OUTPATIENT_CLINIC_OR_DEPARTMENT_OTHER): Payer: Self-pay | Admitting: *Deleted

## 2018-03-06 DIAGNOSIS — N950 Postmenopausal bleeding: Secondary | ICD-10-CM | POA: Diagnosis not present

## 2018-03-06 NOTE — H&P (Signed)
Maria Hurst is an 72 y.o. female. With an episode of post menopausal bleeding.  Had thickened endometrium on ultrasound and normal endometrial bx.  Pertinent Gynecological History: Menses: post-menopausal Bleeding: post menopausal bleeding Contraception: post menopausal status DES exposure: unknown Blood transfusions: none Sexually transmitted diseases: no past history Previous GYN Procedures: none  Last mammogram: normal Date: 07/2017 Last pap: normal Date: 02/2018 OB History: G7, P3   Menstrual History: Menarche age: 48 No LMP recorded. Patient is postmenopausal.    Past Medical History:  Diagnosis Date  . Allergy   . Arthritis   . Glaucoma     Past Surgical History:  Procedure Laterality Date  . CESAREAN SECTION    . COSMETIC SURGERY    . TONSILLECTOMY      Family History  Problem Relation Age of Onset  . Cancer Mother        lung  . Cancer Father        lung    Social History:  reports that she has never smoked. She has never used smokeless tobacco. She reports that she drinks about 10.0 standard drinks of alcohol per week. She reports that she does not use drugs.  Allergies:  Allergies  Allergen Reactions  . Sulfa Antibiotics Hives    Medications Prior to Admission  Medication Sig Dispense Refill Last Dose  . famotidine (PEPCID) 10 MG tablet Take 10 mg by mouth 2 (two) times daily.   03/07/2018 at 0700  . ibuprofen (ADVIL,MOTRIN) 200 MG tablet Take 200 mg by mouth every 6 (six) hours as needed.   Past Week at Unknown time  . Timolol-Hydrochlorothiazide (TIMOLIDE PO) Take 1 drop by mouth daily.   03/07/2018 at 0700  . zolpidem (AMBIEN) 5 MG tablet Take 5 mg by mouth at bedtime as needed for sleep.   03/06/2018 at Unknown time    Review of Systems  Constitutional: Negative.   Respiratory: Negative.   Cardiovascular: Negative.   Gastrointestinal: Negative.   Genitourinary:       Single episode vaginal bleeding.  Musculoskeletal: Negative.      Blood pressure 136/86, pulse 65, temperature 97.9 F (36.6 C), temperature source Oral, resp. rate 16, height 5' 3.75" (1.619 m), weight 78.8 kg, SpO2 98 %. Physical Exam  Constitutional: She is oriented to person, place, and time. She appears well-nourished.  HENT:  Head: Normocephalic and atraumatic.  Eyes: EOM are normal.  Neck: Neck supple.  Cardiovascular: Normal rate and regular rhythm.  Respiratory: Effort normal and breath sounds normal.  GI: Soft. Bowel sounds are normal.  Genitourinary:  Genitourinary Comments: Pelvic exam: normal external genitalia, vulva, vagina, cervix, uterus and adnexa.   Musculoskeletal: Normal range of motion.  Neurological: She is alert and oriented to person, place, and time.  Skin: Skin is warm and dry.  Psychiatric: She has a normal mood and affect.  Ultrasound nl except for endometrial thickness =11.65mm   Assessment/Plan: Postmenopausal bleeding Thickened endometrium.  Recommended:   Hysteroscopy d&c recommended and accepted.  Indications, risks and benefits reviewed and pt wants to proceed.Specific risk of uterine perforation explained in detail at pt and husband's request.  Seymour Bars Haygood 03/07/2018, 9:24 AM

## 2018-03-07 ENCOUNTER — Encounter (HOSPITAL_BASED_OUTPATIENT_CLINIC_OR_DEPARTMENT_OTHER)
Admission: RE | Disposition: A | Discharge status: Home / Self Care | Payer: Self-pay | Source: Ambulatory Visit | Attending: Obstetrics and Gynecology

## 2018-03-07 ENCOUNTER — Ambulatory Visit (HOSPITAL_BASED_OUTPATIENT_CLINIC_OR_DEPARTMENT_OTHER)
Admission: RE | Admit: 2018-03-07 | Discharge: 2018-03-07 | Disposition: A | Discharge status: Home / Self Care | Payer: PPO | Source: Ambulatory Visit | Attending: Obstetrics and Gynecology | Admitting: Obstetrics and Gynecology

## 2018-03-07 ENCOUNTER — Encounter (HOSPITAL_BASED_OUTPATIENT_CLINIC_OR_DEPARTMENT_OTHER): Payer: Self-pay | Admitting: Certified Registered"

## 2018-03-07 ENCOUNTER — Ambulatory Visit (HOSPITAL_BASED_OUTPATIENT_CLINIC_OR_DEPARTMENT_OTHER): Payer: PPO | Admitting: Certified Registered"

## 2018-03-07 ENCOUNTER — Other Ambulatory Visit: Payer: Self-pay

## 2018-03-07 DIAGNOSIS — I10 Essential (primary) hypertension: Secondary | ICD-10-CM | POA: Diagnosis not present

## 2018-03-07 DIAGNOSIS — N840 Polyp of corpus uteri: Secondary | ICD-10-CM | POA: Insufficient documentation

## 2018-03-07 DIAGNOSIS — H409 Unspecified glaucoma: Secondary | ICD-10-CM | POA: Diagnosis not present

## 2018-03-07 DIAGNOSIS — N950 Postmenopausal bleeding: Secondary | ICD-10-CM | POA: Diagnosis not present

## 2018-03-07 DIAGNOSIS — R9389 Abnormal findings on diagnostic imaging of other specified body structures: Secondary | ICD-10-CM | POA: Diagnosis not present

## 2018-03-07 DIAGNOSIS — Z882 Allergy status to sulfonamides status: Secondary | ICD-10-CM | POA: Diagnosis not present

## 2018-03-07 DIAGNOSIS — Z79899 Other long term (current) drug therapy: Secondary | ICD-10-CM | POA: Diagnosis not present

## 2018-03-07 DIAGNOSIS — M1990 Unspecified osteoarthritis, unspecified site: Secondary | ICD-10-CM | POA: Diagnosis not present

## 2018-03-07 HISTORY — PX: DILATATION & CURRETTAGE/HYSTEROSCOPY WITH RESECTOCOPE: SHX5572

## 2018-03-07 SURGERY — DILATATION & CURETTAGE/HYSTEROSCOPY WITH RESECTOCOPE
Anesthesia: General

## 2018-03-07 MED ORDER — KETOROLAC TROMETHAMINE 30 MG/ML IJ SOLN
INTRAMUSCULAR | Status: AC
  Filled 2018-03-07: qty 2

## 2018-03-07 MED ORDER — DEXAMETHASONE SODIUM PHOSPHATE 10 MG/ML IJ SOLN
INTRAMUSCULAR | Status: AC
  Filled 2018-03-07: qty 1

## 2018-03-07 MED ORDER — PROPOFOL 10 MG/ML IV BOLUS
INTRAVENOUS | Status: AC
  Filled 2018-03-07: qty 20

## 2018-03-07 MED ORDER — OXYCODONE HCL 5 MG PO TABS
5.0000 mg | ORAL_TABLET | Freq: Once | ORAL | Status: AC
  Administered 2018-03-07: 5 mg via ORAL
  Filled 2018-03-07: qty 1

## 2018-03-07 MED ORDER — LIDOCAINE HCL 2 % IJ SOLN
INTRAMUSCULAR | Status: DC | PRN
  Administered 2018-03-07: 10 mL

## 2018-03-07 MED ORDER — FENTANYL CITRATE (PF) 100 MCG/2ML IJ SOLN
INTRAMUSCULAR | Status: DC | PRN
  Administered 2018-03-07: 25 ug via INTRAVENOUS
  Administered 2018-03-07: 50 ug via INTRAVENOUS

## 2018-03-07 MED ORDER — PROMETHAZINE HCL 25 MG/ML IJ SOLN
6.2500 mg | INTRAMUSCULAR | Status: DC | PRN
  Filled 2018-03-07: qty 1

## 2018-03-07 MED ORDER — IBUPROFEN 600 MG PO TABS: ORAL_TABLET | ORAL | 0 refills | Status: DC

## 2018-03-07 MED ORDER — ONDANSETRON HCL 4 MG/2ML IJ SOLN
INTRAMUSCULAR | Status: AC
  Filled 2018-03-07: qty 2

## 2018-03-07 MED ORDER — MEPERIDINE HCL 25 MG/ML IJ SOLN
6.2500 mg | INTRAMUSCULAR | Status: DC | PRN
  Filled 2018-03-07: qty 1

## 2018-03-07 MED ORDER — FENTANYL CITRATE (PF) 100 MCG/2ML IJ SOLN
INTRAMUSCULAR | Status: AC
  Filled 2018-03-07: qty 2

## 2018-03-07 MED ORDER — LACTATED RINGERS IV SOLN
INTRAVENOUS | Status: DC
  Administered 2018-03-07: 09:00:00 via INTRAVENOUS
  Filled 2018-03-07: qty 1000

## 2018-03-07 MED ORDER — LIDOCAINE 2% (20 MG/ML) 5 ML SYRINGE
INTRAMUSCULAR | Status: AC
  Filled 2018-03-07: qty 5

## 2018-03-07 MED ORDER — ONDANSETRON HCL 4 MG/2ML IJ SOLN
INTRAMUSCULAR | Status: DC | PRN
  Administered 2018-03-07: 4 mg via INTRAVENOUS

## 2018-03-07 MED ORDER — SODIUM CHLORIDE 0.9 % IR SOLN
Status: DC | PRN
  Administered 2018-03-07: 3000 mL

## 2018-03-07 MED ORDER — DEXAMETHASONE SODIUM PHOSPHATE 10 MG/ML IJ SOLN
INTRAMUSCULAR | Status: DC | PRN
  Administered 2018-03-07: 10 mg via INTRAVENOUS

## 2018-03-07 MED ORDER — OXYCODONE HCL 5 MG PO TABS
ORAL_TABLET | ORAL | Status: AC
  Filled 2018-03-07: qty 1

## 2018-03-07 MED ORDER — KETOROLAC TROMETHAMINE 30 MG/ML IJ SOLN
INTRAMUSCULAR | Status: DC | PRN
  Administered 2018-03-07: 30 mg via INTRAMUSCULAR
  Administered 2018-03-07: 30 mg via INTRAVENOUS

## 2018-03-07 MED ORDER — MIDAZOLAM HCL 2 MG/2ML IJ SOLN
0.5000 mg | Freq: Once | INTRAMUSCULAR | Status: DC | PRN
  Filled 2018-03-07: qty 2

## 2018-03-07 MED ORDER — PROPOFOL 10 MG/ML IV BOLUS
INTRAVENOUS | Status: DC | PRN
  Administered 2018-03-07: 150 mg via INTRAVENOUS

## 2018-03-07 MED ORDER — FENTANYL CITRATE (PF) 100 MCG/2ML IJ SOLN
25.0000 ug | INTRAMUSCULAR | Status: DC | PRN
  Filled 2018-03-07: qty 1

## 2018-03-07 MED ORDER — LIDOCAINE 2% (20 MG/ML) 5 ML SYRINGE
INTRAMUSCULAR | Status: DC | PRN
  Administered 2018-03-07: 50 mg via INTRAVENOUS

## 2018-03-07 SURGICAL SUPPLY — 17 items
BIPOLAR CUTTING LOOP 21FR (ELECTRODE)
CANISTER SUCT 3000ML PPV (MISCELLANEOUS) ×3 IMPLANT
CATH ROBINSON RED A/P 16FR (CATHETERS) ×3 IMPLANT
COVER WAND RF STERILE (DRAPES) ×3 IMPLANT
CURETTE PIPELLE ENDOMTRL SUCTN (MISCELLANEOUS) IMPLANT
DILATOR CANAL MILEX (MISCELLANEOUS) IMPLANT
GAUZE 4X4 16PLY RFD (DISPOSABLE) ×3 IMPLANT
GLOVE SURG SS PI 6.5 STRL IVOR (GLOVE) ×3 IMPLANT
GOWN STRL REUS W/TWL LRG LVL3 (GOWN DISPOSABLE) ×6 IMPLANT
KIT PROCEDURE FLUENT (KITS) ×3 IMPLANT
KIT TURNOVER CYSTO (KITS) ×3 IMPLANT
LOOP CUTTING BIPOLAR 21FR (ELECTRODE) IMPLANT
PACK VAGINAL MINOR WOMEN LF (CUSTOM PROCEDURE TRAY) ×3 IMPLANT
PAD OB MATERNITY 4.3X12.25 (PERSONAL CARE ITEMS) ×3 IMPLANT
PIPELLE ENDOMETRIAL SUCTION CU (MISCELLANEOUS)
TOWEL OR 17X24 6PK STRL BLUE (TOWEL DISPOSABLE) ×6 IMPLANT
WATER STERILE IRR 500ML POUR (IV SOLUTION) ×3 IMPLANT

## 2018-03-07 NOTE — Transfer of Care (Signed)
Immediate Anesthesia Transfer of Care Note  Patient: Maria Hurst  Procedure(s) Performed: Procedure(s) (LRB): DILATATION & CURETTAGE/HYSTEROSCOPY WITH RESECTION (N/A)  Patient Location: PACU  Anesthesia Type: General  Level of Consciousness: awake, oriented, sedated and patient cooperative  Airway & Oxygen Therapy: Patient Spontanous Breathing and Patient connected to face mask oxygen  Post-op Assessment: Report given to PACU RN and Post -op Vital signs reviewed and stable  Post vital signs: Reviewed and stable  Complications: No apparent anesthesia complications  Last Vitals:  Vitals Value Taken Time  BP 168/96 03/07/2018 10:30 AM  Temp 36.8 C 03/07/2018 10:30 AM  Pulse 57 03/07/2018 10:32 AM  Resp 14 03/07/2018 10:32 AM  SpO2 100 % 03/07/2018 10:32 AM  Vitals shown include unvalidated device data.  Last Pain:  Vitals:   03/07/18 0849  TempSrc:   PainSc: 0-No pain      Patients Stated Pain Goal: 3 (03/07/18 0849)

## 2018-03-07 NOTE — Anesthesia Preprocedure Evaluation (Addendum)
Anesthesia Evaluation  Patient identified by MRN, date of birth, ID band Patient awake    Reviewed: Allergy & Precautions, NPO status , Patient's Chart, lab work & pertinent test results  History of Anesthesia Complications Negative for: history of anesthetic complications  Airway Mallampati: II  TM Distance: >3 FB Neck ROM: Full    Dental  (+) Dental Advisory Given   Pulmonary neg pulmonary ROS,    breath sounds clear to auscultation       Cardiovascular negative cardio ROS   Rhythm:Regular Rate:Normal     Neuro/Psych glaucoma    GI/Hepatic Neg liver ROS, GERD  Medicated and Controlled,  Endo/Other  obesity  Renal/GU negative Renal ROS     Musculoskeletal  (+) Arthritis ,   Abdominal (+) + obese,   Peds  Hematology negative hematology ROS (+)   Anesthesia Other Findings   Reproductive/Obstetrics                            Anesthesia Physical Anesthesia Plan  ASA: II  Anesthesia Plan: General   Post-op Pain Management:    Induction: Intravenous  PONV Risk Score and Plan: 3 and Ondansetron, Dexamethasone and Treatment may vary due to age or medical condition  Airway Management Planned: LMA  Additional Equipment:   Intra-op Plan:   Post-operative Plan:   Informed Consent: I have reviewed the patients History and Physical, chart, labs and discussed the procedure including the risks, benefits and alternatives for the proposed anesthesia with the patient or authorized representative who has indicated his/her understanding and acceptance.   Dental advisory given  Plan Discussed with: CRNA and Surgeon  Anesthesia Plan Comments: (Plan routine monitors, GA- LMA OK)        Anesthesia Quick Evaluation

## 2018-03-07 NOTE — Op Note (Signed)
03/07/2018  10:32 AM  PATIENT:  Maria Hurst  72 y.o. female  PRE-OPERATIVE DIAGNOSIS:  Post-menopausal bleeding  POST-OPERATIVE DIAGNOSIS:  Post-menopausal bleeding  PROCEDURE:  Procedure(s): DILATATION & CURETTAGE/HYSTEROSCOPY WITH RESECTION  SURGEON:  Eldred Manges, MD  ASSISTANTS: None  ANESTHESIA:   general  ESTIMATED BLOOD LOSS: 0 mL   BLOOD ADMINISTERED:none  COMPLICATIONS: None  FINDINGS: The uterus sounded to 8 cm.  At hysteroscopy the majority of the endometrial cavity was quite atrophic.  There was on the left pelvic sidewall a 2 mm rounded lesion that was loosely attached to the sidewall reminiscent of an atrophic degenerated fibroid.  No other lesions were noted.  The right tubal ostium was visualized the left tubal ostium was nonvisualized as the patient has had a left salpingo-oophorectomy.  FLUID DEFICIT:255cc  LOCAL MEDICATIONS USED:  XYLOCAINE  and Amount: 10 ml  SPECIMEN:  Source of Specimen:  1) endocervical curettings 2) endometrial curettings 3) endometrial lesion  DISPOSITION OF SPECIMEN:  PATHOLOGY  COUNTS:  YES  DESCRIPTION OF PROCEDURE:the patient was taken to the operating room after appropriate identification and placed on the operating table. After the attainment of adequate general anesthesia she was placed in the lithotomy position.  Timeout was performed.  The perineum and vagina were prepped with multiple layers of Betadine. The bladder was emptied with a an in and out catheter. The perineum was draped in sterile field. A gray speculum was placed in the vagina. The cervix was grasped with a single-tooth tenaculum. A paracervical block was achieved with a total of 10 cc of 2% Xylocaine and the 5 and 7:00 positions. The uterus was sounded.  The cervix was then dilated to accommodate the diagnostic hysteroscope.  Endocervical space was curetted with minimal tissue obtained.   The hysteroscope was used to evaluate all quadrants of the  uterus with the above-noted findings. The above-noted lesion was then sharply excised with hysteroscopic scissors and removed from the operative field.  The uterus was curetted in all 4 quadrants producing minimal tissue. All instruments were then removed from the vagina and the patient was awakened from general anesthesia and taken to the recovery room in satisfactory condition having tolerated the procedure well sponge and instrument counts correct.  PLAN OF CARE: Charge home after postanesthesia care  PATIENT DISPOSITION:  PACU - hemodynamically stable.   Delay start of Pharmacological VTE agent (>24hrs) due to surgical blood loss or risk of bleeding:  yes SCD hose were used throughout the procedure.   Eldred Manges, MD 10:32 AM

## 2018-03-07 NOTE — Anesthesia Postprocedure Evaluation (Signed)
Anesthesia Post Note  Patient: Maria Hurst  Procedure(s) Performed: DILATATION & CURETTAGE/HYSTEROSCOPY WITH RESECTION (N/A )     Patient location during evaluation: PACU Anesthesia Type: General Level of consciousness: awake and alert, oriented and patient cooperative Pain management: pain level controlled Vital Signs Assessment: post-procedure vital signs reviewed and stable Respiratory status: spontaneous breathing, nonlabored ventilation and respiratory function stable Cardiovascular status: blood pressure returned to baseline and stable Postop Assessment: no apparent nausea or vomiting Anesthetic complications: no    Last Vitals:  Vitals:   03/07/18 1100 03/07/18 1145  BP: (!) 170/93 (!) 154/85  Pulse: (!) 56 (!) 59  Resp: 13 16  Temp:  36.5 C  SpO2: 98%     Last Pain:  Vitals:   03/07/18 1205  TempSrc:   PainSc: 1                  Tennyson Wacha,E. Statia Burdick

## 2018-03-07 NOTE — Anesthesia Procedure Notes (Signed)
Procedure Name: LMA Insertion Date/Time: 03/07/2018 9:39 AM Performed by: Suan Halter, CRNA Pre-anesthesia Checklist: Patient identified, Emergency Drugs available, Suction available and Patient being monitored Patient Re-evaluated:Patient Re-evaluated prior to induction Oxygen Delivery Method: Circle system utilized Preoxygenation: Pre-oxygenation with 100% oxygen Induction Type: IV induction Ventilation: Mask ventilation without difficulty LMA: LMA inserted LMA Size: 4.0 Number of attempts: 1 Airway Equipment and Method: Bite block Placement Confirmation: positive ETCO2 Tube secured with: Tape Dental Injury: Teeth and Oropharynx as per pre-operative assessment

## 2018-03-07 NOTE — Discharge Instructions (Signed)
Hysteroscopy, Care After Refer to this sheet in the next few weeks. These instructions provide you with information on caring for yourself after your procedure. Your health care provider may also give you more specific instructions. Your treatment has been planned according to current medical practices, but problems sometimes occur. Call your health care provider if you have any problems or questions after your procedure. What can I expect after the procedure? After your procedure, it is typical to have the following:  You may have some cramping. This normally lasts for a couple days.  You may have bleeding. This can vary from light spotting for a few days to menstrual-like bleeding for 3-7 days.  Follow these instructions at home:  Rest for the first 1-2 days after the procedure.  Only take over-the-counter or prescription medicines as directed by your health care provider. Do not take aspirin. It can increase the chances of bleeding.  Take showers instead of baths for 2 weeks or as directed by your health care provider.  Do not drive for 24 hours or as directed.  Do not drink alcohol while taking pain medicine.  Do not use tampons, douche, or have sexual intercourse for 2 weeks or until your health care provider says it is okay.  Take your temperature twice a day for 4-5 days. Write it down each time.  Follow your health care provider's advice about diet, exercise, and lifting.  If you develop constipation, you may: ? Take a mild laxative if your health care provider approves. ? Add bran foods to your diet. ? Drink enough fluids to keep your urine clear or pale yellow.  Try to have someone with you or available to you for the first 24-48 hours, especially if you were given a general anesthetic.  Follow up with your health care provider as directed. Contact a health care provider if:  You feel dizzy or lightheaded.  You feel sick to your stomach (nauseous).  You have  abnormal vaginal discharge.  You have a rash.  You have pain that is not controlled with medicine. Get help right away if:  You have bleeding that is heavier than a normal menstrual period.  You have a fever.  You have increasing cramps or pain, not controlled with medicine.  You have new belly (abdominal) pain.  You pass out.  You have pain in the tops of your shoulders (shoulder strap areas).  You have shortness of breath. This information is not intended to replace advice given to you by your health care provider. Make sure you discuss any questions you have with your health care provider. Document Released: 02/12/2013 Document Revised: 09/30/2015 Document Reviewed: 11/21/2012 Elsevier Interactive Patient Education  2017 [place] [facility] Instructions  Activity: Get plenty of rest for the remainder of the day. A responsible individual must stay with you for 24 hours following the procedure.  For the next 24 hours, DO NOT: -Drive a car -Paediatric nurse -Drink alcoholic beverages -Take any medication unless instructed by your physician -Make any legal decisions or sign important papers.  Meals: Start with liquid foods such as gelatin or soup. Progress to regular foods as tolerated. Avoid greasy, spicy, heavy foods. If nausea and/or vomiting occur, drink only clear liquids until the nausea and/or vomiting subsides. Call your physician if vomiting continues.  Special Instructions/Symptoms: Your throat may feel dry or sore from the anesthesia or the breathing tube placed in your throat during surgery. If this causes discomfort, gargle  with warm salt water. The discomfort should disappear within 24 hours. ° °

## 2018-03-08 ENCOUNTER — Encounter (HOSPITAL_BASED_OUTPATIENT_CLINIC_OR_DEPARTMENT_OTHER): Payer: Self-pay | Admitting: Obstetrics and Gynecology

## 2018-03-18 DIAGNOSIS — N952 Postmenopausal atrophic vaginitis: Secondary | ICD-10-CM | POA: Diagnosis not present

## 2018-03-18 DIAGNOSIS — N898 Other specified noninflammatory disorders of vagina: Secondary | ICD-10-CM | POA: Diagnosis not present

## 2018-03-18 DIAGNOSIS — Z4889 Encounter for other specified surgical aftercare: Secondary | ICD-10-CM | POA: Diagnosis not present

## 2018-05-29 DIAGNOSIS — N898 Other specified noninflammatory disorders of vagina: Secondary | ICD-10-CM | POA: Diagnosis not present

## 2018-07-20 DIAGNOSIS — J209 Acute bronchitis, unspecified: Secondary | ICD-10-CM | POA: Diagnosis not present

## 2018-10-17 DIAGNOSIS — L720 Epidermal cyst: Secondary | ICD-10-CM | POA: Diagnosis not present

## 2018-10-17 DIAGNOSIS — D2261 Melanocytic nevi of right upper limb, including shoulder: Secondary | ICD-10-CM | POA: Diagnosis not present

## 2018-10-17 DIAGNOSIS — D1801 Hemangioma of skin and subcutaneous tissue: Secondary | ICD-10-CM | POA: Diagnosis not present

## 2018-10-17 DIAGNOSIS — L218 Other seborrheic dermatitis: Secondary | ICD-10-CM | POA: Diagnosis not present

## 2018-10-17 DIAGNOSIS — D2272 Melanocytic nevi of left lower limb, including hip: Secondary | ICD-10-CM | POA: Diagnosis not present

## 2018-10-17 DIAGNOSIS — D2271 Melanocytic nevi of right lower limb, including hip: Secondary | ICD-10-CM | POA: Diagnosis not present

## 2018-10-17 DIAGNOSIS — D2372 Other benign neoplasm of skin of left lower limb, including hip: Secondary | ICD-10-CM | POA: Diagnosis not present

## 2018-10-17 DIAGNOSIS — D225 Melanocytic nevi of trunk: Secondary | ICD-10-CM | POA: Diagnosis not present

## 2018-10-17 DIAGNOSIS — D2262 Melanocytic nevi of left upper limb, including shoulder: Secondary | ICD-10-CM | POA: Diagnosis not present

## 2018-10-17 DIAGNOSIS — L821 Other seborrheic keratosis: Secondary | ICD-10-CM | POA: Diagnosis not present

## 2018-10-17 DIAGNOSIS — L820 Inflamed seborrheic keratosis: Secondary | ICD-10-CM | POA: Diagnosis not present

## 2019-01-06 ENCOUNTER — Other Ambulatory Visit: Payer: Self-pay

## 2019-01-06 DIAGNOSIS — I739 Peripheral vascular disease, unspecified: Secondary | ICD-10-CM

## 2019-01-14 ENCOUNTER — Other Ambulatory Visit: Payer: Self-pay

## 2019-01-14 ENCOUNTER — Encounter: Payer: PPO | Admitting: Vascular Surgery

## 2019-01-14 ENCOUNTER — Encounter (HOSPITAL_COMMUNITY): Payer: PPO

## 2019-01-14 ENCOUNTER — Ambulatory Visit (HOSPITAL_COMMUNITY)
Admission: RE | Admit: 2019-01-14 | Discharge: 2019-01-14 | Disposition: A | Discharge status: Home / Self Care | Payer: PPO | Source: Ambulatory Visit | Attending: Vascular Surgery | Admitting: Vascular Surgery

## 2019-01-14 ENCOUNTER — Encounter: Payer: Self-pay | Admitting: Vascular Surgery

## 2019-01-14 ENCOUNTER — Ambulatory Visit (INDEPENDENT_AMBULATORY_CARE_PROVIDER_SITE_OTHER): Payer: PPO | Admitting: Vascular Surgery

## 2019-01-14 VITALS — BP 144/83 | HR 61 | Temp 97.8°F | Resp 18 | Ht 63.75 in | Wt 177.0 lb

## 2019-01-14 DIAGNOSIS — M25561 Pain in right knee: Secondary | ICD-10-CM

## 2019-01-14 DIAGNOSIS — M25562 Pain in left knee: Secondary | ICD-10-CM

## 2019-01-14 DIAGNOSIS — I739 Peripheral vascular disease, unspecified: Secondary | ICD-10-CM | POA: Insufficient documentation

## 2019-01-14 NOTE — Progress Notes (Signed)
Vascular and Vein Specialist of [place]  Patient name: Maria Hurst MRN: [id number] DOB: 06/18/1945 Sex: female  REASON FOR CONSULT: Follow-up of abnormal lower extremity screening study as an outpatient  HPI: Maria Hurst is a 73 y.o. female, who is here today for evaluation of potential lower extremity arterial insufficiency.  She reports pain in both lower extremities.  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  She reports pain behind her right knee and both calves and into her feet occasionally.  This is not related to walking and is not typical of intermittent claudication.  He does report discomfort in her legs when she goes to bed at night as well.  She has no history of lower extremity tissue loss.  He has no history of cardiac issues.  No history of stroke.  Underwent screening study from her health insurance and this was mildly abnormal and is here today for vascular lab studies and follow-up  Past Medical History:  Diagnosis Date  . Allergy   . Arthritis   . Glaucoma     Family History  Problem Relation Age of Onset  . Cancer Mother        lung  . Cancer Father        lung    SOCIAL HISTORY: Social History   Socioeconomic History  . Marital status: Married    Spouse name: Not on file  . Number of children: Not on file  . Years of education: Not on file  . Highest education level: Not on file  Occupational History  . Not on file  Social Needs  . Financial resource strain: Not on file  . Food insecurity    Worry: Not on file    Inability: Not on file  . Transportation needs    Medical: Not on file    Non-medical: Not on file  Tobacco Use  . Smoking status: Never Smoker  . Smokeless tobacco: Never Used  Substance and Sexual Activity  . Alcohol use: Yes    Alcohol/week: 10.0 standard drinks    Types: 10 Standard drinks or equivalent per week  . Drug use: No  . Sexual activity: Not on file   Lifestyle  . Physical activity    Days per week: Not on file    Minutes per session: Not on file  . Stress: Not on file  Relationships  . Social Herbalist on phone: Not on file    Gets together: Not on file    Attends religious service: Not on file    Active member of club or organization: Not on file    Attends meetings of clubs or organizations: Not on file    Relationship status: Not on file  . Intimate partner violence    Fear of current or ex partner: Not on file    Emotionally abused: Not on file    Physically abused: Not on file    Forced sexual activity: Not on file  Other Topics Concern  . Not on file  Social History Narrative  . Not on file    Allergies  Allergen Reactions  . Sulfa Antibiotics Hives    Current Outpatient Medications  Medication Sig Dispense Refill  . famotidine (PEPCID) 10 MG tablet Take 10 mg by mouth 2 (two) times daily.    Marland Kitchen ibuprofen (ADVIL,MOTRIN) 600 MG tablet Take 1 tablet orally every 6 hours for 3 days and then every 6  hours as needed for pain 30 tablet 0  . IMVEXXY MAINTENANCE PACK 4 MCG INST     . Timolol-Hydrochlorothiazide (TIMOLIDE PO) Take 1 drop by mouth daily.    Marland Kitchen zolpidem (AMBIEN) 5 MG tablet Take 5 mg by mouth at bedtime as needed for sleep.     No current facility-administered medications for this visit.     REVIEW OF SYSTEMS:  [X]  denotes positive finding, [ ]  denotes negative finding Cardiac  Comments:  Chest pain or chest pressure:    Shortness of breath upon exertion:    Short of breath when lying flat:    Irregular heart rhythm:        Vascular    Pain in calf, thigh, or hip brought on by ambulation: x   Pain in feet at night that wakes you up from your sleep:     Blood clot in your veins:    Leg swelling:         Pulmonary    Oxygen at home:    Productive cough:     Wheezing:         Neurologic    Sudden weakness in arms or legs:     Sudden numbness in arms or legs:     Sudden onset of  difficulty speaking or slurred speech:    Temporary loss of vision in one eye:     Problems with dizziness:         Gastrointestinal    Blood in stool:     Vomited blood:         Genitourinary    Burning when urinating:     Blood in urine:        Psychiatric    Major depression:         Hematologic    Bleeding problems:    Problems with blood clotting too easily:        Skin    Rashes or ulcers:        Constitutional    Fever or chills:      PHYSICAL EXAM: Vitals:   01/14/19 0840  BP: (!) 144/83  Pulse: 61  Resp: 18  Temp: 97.8 F (36.6 C)  SpO2: 97%  Weight: 80.3 kg  Height: 5' 3.75" (1.619 m)    GENERAL: The patient is a well-nourished female, in no acute distress. The vital signs are documented above. CARDIOVASCULAR: Carotid arteries without bruits bilaterally.  She has 2+ radial and 2+ dorsalis pedis pulses bilaterally PULMONARY: There is good air exchange  ABDOMEN: Soft and non-tender.  No abdominal bruits noted MUSCULOSKELETAL: There are no major deformities or cyanosis. NEUROLOGIC: No focal weakness or paresthesias are detected. SKIN: There are no ulcers or rashes noted. PSYCHIATRIC: The patient has a normal affect.  DATA:  Noninvasive studies reveal completely normal ankle arm index and toe brachial index bilaterally and normal triphasic waveforms bilaterally  MEDICAL ISSUES: Discussed these findings with the patient.  Explained that the screening studies can sometimes have false positives.  I reassured her that she does not have any evidence of lower extremity arterial insufficiency.  She was reassured with this discussion will see [place] again on an as-needed basis.  Her leg pain does appear to be more arthritic in nature from her description.   Rosetta Posner, MD FACS [facility] Tel [phone] Pager [phone]

## 2019-01-17 ENCOUNTER — Encounter: Payer: PPO | Admitting: Vascular Surgery

## 2019-01-17 ENCOUNTER — Encounter (HOSPITAL_COMMUNITY): Payer: PPO

## 2019-04-15 DIAGNOSIS — L720 Epidermal cyst: Secondary | ICD-10-CM | POA: Diagnosis not present

## 2019-04-15 DIAGNOSIS — L821 Other seborrheic keratosis: Secondary | ICD-10-CM | POA: Diagnosis not present

## 2019-04-15 DIAGNOSIS — L820 Inflamed seborrheic keratosis: Secondary | ICD-10-CM | POA: Diagnosis not present

## 2019-06-01 DIAGNOSIS — Z20828 Contact with and (suspected) exposure to other viral communicable diseases: Secondary | ICD-10-CM | POA: Diagnosis not present

## 2019-06-01 DIAGNOSIS — Z20822 Contact with and (suspected) exposure to covid-19: Secondary | ICD-10-CM | POA: Diagnosis not present

## 2019-06-05 ENCOUNTER — Ambulatory Visit: Payer: PPO

## 2019-06-13 ENCOUNTER — Ambulatory Visit: Payer: PPO | Attending: Internal Medicine

## 2019-06-13 DIAGNOSIS — E559 Vitamin D deficiency, unspecified: Secondary | ICD-10-CM | POA: Diagnosis not present

## 2019-06-13 DIAGNOSIS — E782 Mixed hyperlipidemia: Secondary | ICD-10-CM | POA: Diagnosis not present

## 2019-06-13 DIAGNOSIS — R7301 Impaired fasting glucose: Secondary | ICD-10-CM | POA: Diagnosis not present

## 2019-06-13 DIAGNOSIS — R519 Headache, unspecified: Secondary | ICD-10-CM | POA: Diagnosis not present

## 2019-06-13 DIAGNOSIS — J309 Allergic rhinitis, unspecified: Secondary | ICD-10-CM | POA: Diagnosis not present

## 2019-06-13 DIAGNOSIS — R0789 Other chest pain: Secondary | ICD-10-CM | POA: Diagnosis not present

## 2019-06-13 DIAGNOSIS — R030 Elevated blood-pressure reading, without diagnosis of hypertension: Secondary | ICD-10-CM | POA: Diagnosis not present

## 2019-06-13 DIAGNOSIS — G4700 Insomnia, unspecified: Secondary | ICD-10-CM | POA: Diagnosis not present

## 2019-06-13 DIAGNOSIS — R7309 Other abnormal glucose: Secondary | ICD-10-CM | POA: Diagnosis not present

## 2019-06-13 DIAGNOSIS — M8588 Other specified disorders of bone density and structure, other site: Secondary | ICD-10-CM | POA: Diagnosis not present

## 2019-06-13 DIAGNOSIS — H4050X0 Glaucoma secondary to other eye disorders, unspecified eye, stage unspecified: Secondary | ICD-10-CM | POA: Diagnosis not present

## 2019-06-13 DIAGNOSIS — Z23 Encounter for immunization: Secondary | ICD-10-CM | POA: Insufficient documentation

## 2019-06-13 NOTE — Progress Notes (Signed)
   Covid-19 Vaccination Clinic  Name:  Maria Hurst    MRN: [id number] DOB: 1945/08/06  06/13/2019  [name] was observed post Covid-19 immunization for 15 minutes without incidence. She was provided with Vaccine Information Sheet and instruction to access the V-Safe system.   [name] was instructed to call 911 with any severe reactions post vaccine: Marland Kitchen Difficulty breathing  . Swelling of your face and throat  . A fast heartbeat  . A bad rash all over your body  . Dizziness and weakness    Immunizations Administered    Name Date Dose VIS Date Route   Pfizer COVID-19 Vaccine 06/13/2019  3:16 PM 0.3 mL 04/18/2019 Intramuscular   Manufacturer: [place]   Lot: [id number]   [place]: [id number]

## 2019-06-28 ENCOUNTER — Telehealth: Payer: Self-pay | Admitting: *Deleted

## 2019-06-28 NOTE — Telephone Encounter (Signed)
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

## 2019-07-04 ENCOUNTER — Encounter: Payer: Self-pay | Admitting: Cardiology

## 2019-07-04 ENCOUNTER — Ambulatory Visit (INDEPENDENT_AMBULATORY_CARE_PROVIDER_SITE_OTHER): Payer: PPO | Admitting: Cardiology

## 2019-07-04 ENCOUNTER — Other Ambulatory Visit: Payer: Self-pay

## 2019-07-04 VITALS — BP 169/83 | HR 63 | Temp 97.0°F | Ht 63.75 in | Wt 174.2 lb

## 2019-07-04 DIAGNOSIS — M25511 Pain in right shoulder: Secondary | ICD-10-CM

## 2019-07-04 DIAGNOSIS — I10 Essential (primary) hypertension: Secondary | ICD-10-CM | POA: Diagnosis not present

## 2019-07-04 DIAGNOSIS — M25512 Pain in left shoulder: Secondary | ICD-10-CM | POA: Diagnosis not present

## 2019-07-04 DIAGNOSIS — E7800 Pure hypercholesterolemia, unspecified: Secondary | ICD-10-CM | POA: Diagnosis not present

## 2019-07-04 MED ORDER — EZETIMIBE 10 MG PO TABS: 10.0000 mg | ORAL_TABLET | Freq: Every day | ORAL | 2 refills | Status: AC

## 2019-07-04 MED ORDER — LOSARTAN POTASSIUM 25 MG PO TABS: 25.0000 mg | ORAL_TABLET | Freq: Every day | ORAL | 2 refills | Status: AC

## 2019-07-04 NOTE — Progress Notes (Signed)
Primary Physician/Referring:  Cari Caraway, MD  Patient ID: Maria Hurst, female    DOB: May 27, 1945, 74 y.o.   MRN: [id number]  Chief Complaint  Patient presents with  . Chest Pain  . PAD  . Follow-up   HPI:    Maria Hurst  is a 74 y.o. Caucasian female who I had last seen in January 2019 when she was referred for cardiomegaly noted on chest x-ray.  Past medical history significant for  hyperlipidemia and statin intolerance to Lipitor, pravastatin and Zocor, osteopenia and irritable bowel syndrome. She is now referred to me for evaluation of bilateral shoulder pain.  Echocardiogram in 2019 had revealed moderate LVH but normal LVEF.   About 4 to 5 weeks ago she developed bilateral shoulder pain, unrelated to exercise and no other associated symptoms.  She also felt her shoulders were heavy when she tried to lift them.  However gradually over time her symptoms are improved and states that today she does not have any arm discomfort.  Past Medical History:  Diagnosis Date  . Allergy   . Arthritis   . Glaucoma    Past Surgical History:  Procedure Laterality Date  . CESAREAN SECTION    . COSMETIC SURGERY    . DILATATION & CURRETTAGE/HYSTEROSCOPY WITH RESECTOCOPE N/A 03/07/2018   Procedure: DILATATION & CURETTAGE/HYSTEROSCOPY WITH RESECTION;  Surgeon: Eldred Manges, MD;  Location: [facility];  Service: Gynecology;  Laterality: N/A;  1 Hour  . TONSILLECTOMY     Family History  Problem Relation Age of Onset  . Cancer Mother        lung  . Cancer Father        lung    Social History   Tobacco Use  . Smoking status: Never Smoker  . Smokeless tobacco: Never Used  Substance Use Topics  . Alcohol use: Yes    Alcohol/week: 10.0 standard drinks    Types: 10 Standard drinks or equivalent per week   ROS  Review of Systems  Cardiovascular: Negative for chest pain, dyspnea on exertion and leg swelling.  Gastrointestinal: Negative for melena.    Objective  Blood pressure (!) 169/83, pulse 63, temperature (!) 97 F (36.1 C), height 5' 3.75" (1.619 m), weight 174 lb 3.2 oz (79 kg), SpO2 97 %.  Vitals with BMI 07/04/2019 01/14/2019 03/07/2018  Height 5' 3.75" 5' 3.75" -  Weight 174 lbs 3 oz 177 lbs -  BMI [id number] -  Systolic [id number]  Diastolic 83 83 85  Pulse 63 61 59     Physical Exam  Cardiovascular: Normal rate, regular rhythm, normal heart sounds and intact distal pulses. Exam reveals no gallop.  No murmur heard. No leg edema, no JVD.  Pulmonary/Chest: Effort normal and breath sounds normal.  Abdominal: Soft. Bowel sounds are normal.   Laboratory examination:   External labs:    06/14/2019: A1c 6.3%.  TSH normal.  Serum glucose 125, BUN 18, creatinine 0.53, CRP normal.  Total cholesterol 230, HDL 39, triglycerides: 113, LDL 151.  Non-HDL cholesterol 190.  Medications and allergies   Allergies  Allergen Reactions  . Sulfa Antibiotics Hives     Current Outpatient Medications  Medication Instructions  . acetaminophen (TYLENOL) 650 mg, Oral, Every 6 hours PRN  . amoxicillin (AMOXIL) 500 mg, Oral, 4 times daily  . aspirin EC 81 mg, Oral, Daily  . ezetimibe (ZETIA) 10 mg, Oral, Daily after supper  . losartan (COZAAR) 25 mg, Oral, Daily after  supper  . rosuvastatin (CRESTOR) 10 MG tablet 1 tablet, Oral, Nightly  . timolol (TIMOPTIC-XR) 0.5 % ophthalmic gel-forming 1 drop, Both Eyes, Daily  . Vitamin D (Ergocalciferol) (DRISDOL) 50,000 Units, Oral, Every 7 days  . zolpidem (AMBIEN) 5 mg, Oral, At bedtime PRN   Radiology:   No results found.  Cardiac Studies:   Normal 06/14/2019: A1c 6.3%.  TSH normal. Serum glucose 125, BUN 18, creatinine 0.53, CRP normal. Total cholesterol 230, HDL 39, triglycerides: 113, LDL 151.  Non-HDL cholesterol 190.  EKG 07/04/2019: Normal sinus rhythm at rate of 61 bpm, normal axis.  Poor R wave progression, probably normal variant.  No evidence of ischemia, normal EKG.    Assessment     ICD-10-CM   1. Acute pain of both shoulders  M25.511 EKG 12-Lead   M25.512   2. Hypercholesteremia  E78.00 ezetimibe (ZETIA) 10 MG tablet  3. Primary hypertension  I10 losartan (COZAAR) 25 MG tablet    Basic metabolic panel      Meds ordered this encounter  Medications  . ezetimibe (ZETIA) 10 MG tablet    Sig: Take 1 tablet (10 mg total) by mouth daily after supper.    Dispense:  30 tablet    Refill:  2  . losartan (COZAAR) 25 MG tablet    Sig: Take 1 tablet (25 mg total) by mouth daily after supper.    Dispense:  30 tablet    Refill:  2    Medications Discontinued During This Encounter  Medication Reason  . IMVEXXY MAINTENANCE PACK 4 MCG INST Error  . famotidine (PEPCID) 10 MG tablet Error  . ibuprofen (ADVIL,MOTRIN) 600 MG tablet Error  . Timolol-Hydrochlorothiazide (TIMOLIDE PO) Error     Recommendations:   Maria Hurst  is a 74 y.o.  Caucasian female who I had last seen in January 2019 when she was referred for cardiomegaly noted on chest x-ray.  Past medical history significant for  hyperlipidemia and statin intolerance to Lipitor, pravastatin and Zocor, osteopenia and irritable bowel syndrome. She is now referred to me for evaluation of bilateral shoulder pain.  Echocardiogram in 2019 had revealed moderate LVH but normal LVEF.  She is referred to me evaluation of shoulder pain.,  I do not suspect angina pectoris.  She has no chest pain, no shortness of breath, advised her to increase her physical activity and if she notices any abnormal symptoms to call me.  Obviously she can certainly have acute cardiac events but I do not think she has significant CAD at this time.  Regard to hyperlipidemia, she has restarted back rosuvastatin once a week.  Advised her to start Zetia 10 mg in the evening and she could also change Crestor tablet 10 mg from once a week to 1/2 tablet Tuesdays and Thursdays.  If he tolerates this she could gradually increase the Crestor,  previously has had myalgias and also hair loss. Nexlezit 180 mg / 10 mg once a day would also be an option.  Regard to hypertension, she has also noticed elevated blood pressure, I believe she does indeed have primary hypertension.  Started on losartan 25 mg in the evening, BMP in 2 weeks, she will follow-up with her PCP for further management of both lipids and hypertension and I will see her back if she were to develop any exertional chest pain or dyspnea.  I do not see any current indication for her to resume physical activity or exercise.  Her labs were reviewed.  Adrian Prows,  MD, [facility] 07/04/2019, 11:03 AM [facility] [place] Office: [phone]

## 2019-07-05 ENCOUNTER — Ambulatory Visit: Payer: PPO | Attending: Internal Medicine

## 2019-07-05 DIAGNOSIS — Z23 Encounter for immunization: Secondary | ICD-10-CM | POA: Insufficient documentation

## 2019-07-05 NOTE — Progress Notes (Signed)
   Covid-19 Vaccination Clinic  Name:  ARIEAL LEMIN    MRN: [id number] DOB: [birth year]  07/05/2019  Ms. Gideon was observed post Covid-19 immunization for 30 minutes based on pre-vaccination screening without incidence. She was provided with Vaccine Information Sheet and instruction to access the V-Safe system.   Ms. Vicencio was instructed to call 911 with any severe reactions post vaccine: Marland Kitchen Difficulty breathing  . Swelling of your face and throat  . A fast heartbeat  . A bad rash all over your body  . Dizziness and weakness    Immunizations Administered    Name Date Dose VIS Date Route   Pfizer COVID-19 Vaccine 07/05/2019  6:16 PM 0.3 mL 04/18/2019 Intramuscular   Manufacturer: [place]   Lot: [id number]   [place]: [id number]

## 2019-07-07 DIAGNOSIS — I10 Essential (primary) hypertension: Secondary | ICD-10-CM | POA: Diagnosis not present

## 2019-07-07 DIAGNOSIS — E782 Mixed hyperlipidemia: Secondary | ICD-10-CM | POA: Diagnosis not present

## 2019-07-07 DIAGNOSIS — M25561 Pain in right knee: Secondary | ICD-10-CM | POA: Diagnosis not present

## 2019-07-09 ENCOUNTER — Ambulatory Visit: Payer: PPO

## 2019-07-09 DIAGNOSIS — M25561 Pain in right knee: Secondary | ICD-10-CM | POA: Diagnosis not present

## 2019-07-14 DIAGNOSIS — M25561 Pain in right knee: Secondary | ICD-10-CM | POA: Diagnosis not present

## 2019-07-17 DIAGNOSIS — M25561 Pain in right knee: Secondary | ICD-10-CM | POA: Diagnosis not present

## 2019-07-17 DIAGNOSIS — M1711 Unilateral primary osteoarthritis, right knee: Secondary | ICD-10-CM | POA: Diagnosis not present

## 2019-07-17 DIAGNOSIS — S83241A Other tear of medial meniscus, current injury, right knee, initial encounter: Secondary | ICD-10-CM | POA: Diagnosis not present

## 2019-07-18 DIAGNOSIS — I10 Essential (primary) hypertension: Secondary | ICD-10-CM | POA: Diagnosis not present

## 2019-07-19 LAB — BASIC METABOLIC PANEL
BUN/Creatinine Ratio: 32 — ABNORMAL HIGH (ref 12–28)
BUN: 23 mg/dL (ref 8–27)
CO2: 19 mmol/L — ABNORMAL LOW (ref 20–29)
Calcium: 10.4 mg/dL — ABNORMAL HIGH (ref 8.7–10.3)
Chloride: 100 mmol/L (ref 96–106)
Creatinine, Ser: 0.72 mg/dL (ref 0.57–1.00)
GFR calc Af Amer: 97 mL/min/{1.73_m2} (ref >59)
GFR calc non Af Amer: 84 mL/min/{1.73_m2} (ref >59)
Glucose: 257 mg/dL — ABNORMAL HIGH (ref 65–99)
Potassium: 4.9 mmol/L (ref 3.5–5.2)
Sodium: 137 mmol/L (ref 134–144)

## 2019-09-16 DIAGNOSIS — N952 Postmenopausal atrophic vaginitis: Secondary | ICD-10-CM | POA: Diagnosis not present

## 2019-09-16 DIAGNOSIS — J309 Allergic rhinitis, unspecified: Secondary | ICD-10-CM | POA: Diagnosis not present

## 2019-09-16 DIAGNOSIS — G4700 Insomnia, unspecified: Secondary | ICD-10-CM | POA: Diagnosis not present

## 2019-09-16 DIAGNOSIS — S83203D Other tear of unspecified meniscus, current injury, right knee, subsequent encounter: Secondary | ICD-10-CM | POA: Diagnosis not present

## 2019-09-16 DIAGNOSIS — E782 Mixed hyperlipidemia: Secondary | ICD-10-CM | POA: Diagnosis not present

## 2019-09-16 DIAGNOSIS — H4050X0 Glaucoma secondary to other eye disorders, unspecified eye, stage unspecified: Secondary | ICD-10-CM | POA: Diagnosis not present

## 2019-09-16 DIAGNOSIS — H9190 Unspecified hearing loss, unspecified ear: Secondary | ICD-10-CM | POA: Diagnosis not present

## 2019-09-16 DIAGNOSIS — I10 Essential (primary) hypertension: Secondary | ICD-10-CM | POA: Diagnosis not present

## 2019-09-16 DIAGNOSIS — E559 Vitamin D deficiency, unspecified: Secondary | ICD-10-CM | POA: Diagnosis not present

## 2019-09-16 DIAGNOSIS — K219 Gastro-esophageal reflux disease without esophagitis: Secondary | ICD-10-CM | POA: Diagnosis not present

## 2019-09-16 DIAGNOSIS — R7309 Other abnormal glucose: Secondary | ICD-10-CM | POA: Diagnosis not present

## 2019-09-16 DIAGNOSIS — R6882 Decreased libido: Secondary | ICD-10-CM | POA: Diagnosis not present

## 2019-10-10 DIAGNOSIS — M25561 Pain in right knee: Secondary | ICD-10-CM | POA: Diagnosis not present

## 2019-10-21 DIAGNOSIS — Y999 Unspecified external cause status: Secondary | ICD-10-CM | POA: Diagnosis not present

## 2019-10-21 DIAGNOSIS — G8918 Other acute postprocedural pain: Secondary | ICD-10-CM | POA: Diagnosis not present

## 2019-10-21 DIAGNOSIS — S83231A Complex tear of medial meniscus, current injury, right knee, initial encounter: Secondary | ICD-10-CM | POA: Diagnosis not present

## 2019-10-21 DIAGNOSIS — X58XXXA Exposure to other specified factors, initial encounter: Secondary | ICD-10-CM | POA: Diagnosis not present

## 2019-11-18 DIAGNOSIS — Z5189 Encounter for other specified aftercare: Secondary | ICD-10-CM | POA: Diagnosis not present

## 2019-11-18 DIAGNOSIS — M25561 Pain in right knee: Secondary | ICD-10-CM | POA: Diagnosis not present

## 2019-11-24 DIAGNOSIS — Z01419 Encounter for gynecological examination (general) (routine) without abnormal findings: Secondary | ICD-10-CM | POA: Diagnosis not present

## 2019-11-24 DIAGNOSIS — Z1389 Encounter for screening for other disorder: Secondary | ICD-10-CM | POA: Diagnosis not present

## 2019-11-24 DIAGNOSIS — E782 Mixed hyperlipidemia: Secondary | ICD-10-CM | POA: Diagnosis not present

## 2019-11-24 DIAGNOSIS — R6882 Decreased libido: Secondary | ICD-10-CM | POA: Diagnosis not present

## 2019-11-24 DIAGNOSIS — Z0000 Encounter for general adult medical examination without abnormal findings: Secondary | ICD-10-CM | POA: Diagnosis not present

## 2019-11-24 DIAGNOSIS — N952 Postmenopausal atrophic vaginitis: Secondary | ICD-10-CM | POA: Diagnosis not present

## 2019-11-24 DIAGNOSIS — M25561 Pain in right knee: Secondary | ICD-10-CM | POA: Diagnosis not present

## 2019-11-28 ENCOUNTER — Other Ambulatory Visit: Payer: Self-pay | Admitting: Family Medicine

## 2019-11-28 DIAGNOSIS — Z1231 Encounter for screening mammogram for malignant neoplasm of breast: Secondary | ICD-10-CM

## 2019-11-28 DIAGNOSIS — M8588 Other specified disorders of bone density and structure, other site: Secondary | ICD-10-CM

## 2019-12-24 DIAGNOSIS — C4362 Malignant melanoma of left upper limb, including shoulder: Secondary | ICD-10-CM | POA: Diagnosis not present

## 2019-12-24 DIAGNOSIS — D225 Melanocytic nevi of trunk: Secondary | ICD-10-CM | POA: Diagnosis not present

## 2019-12-24 DIAGNOSIS — D2272 Melanocytic nevi of left lower limb, including hip: Secondary | ICD-10-CM | POA: Diagnosis not present

## 2019-12-24 DIAGNOSIS — D2262 Melanocytic nevi of left upper limb, including shoulder: Secondary | ICD-10-CM | POA: Diagnosis not present

## 2019-12-24 DIAGNOSIS — D485 Neoplasm of uncertain behavior of skin: Secondary | ICD-10-CM | POA: Diagnosis not present

## 2019-12-24 DIAGNOSIS — L218 Other seborrheic dermatitis: Secondary | ICD-10-CM | POA: Diagnosis not present

## 2019-12-24 DIAGNOSIS — D2261 Melanocytic nevi of right upper limb, including shoulder: Secondary | ICD-10-CM | POA: Diagnosis not present

## 2019-12-24 DIAGNOSIS — L821 Other seborrheic keratosis: Secondary | ICD-10-CM | POA: Diagnosis not present

## 2019-12-24 DIAGNOSIS — D2271 Melanocytic nevi of right lower limb, including hip: Secondary | ICD-10-CM | POA: Diagnosis not present

## 2020-01-08 DIAGNOSIS — L905 Scar conditions and fibrosis of skin: Secondary | ICD-10-CM | POA: Diagnosis not present

## 2020-01-08 DIAGNOSIS — Z85820 Personal history of malignant melanoma of skin: Secondary | ICD-10-CM | POA: Diagnosis not present

## 2020-01-08 DIAGNOSIS — C4362 Malignant melanoma of left upper limb, including shoulder: Secondary | ICD-10-CM | POA: Diagnosis not present

## 2020-02-20 ENCOUNTER — Ambulatory Visit
Admission: RE | Admit: 2020-02-20 | Discharge: 2020-02-20 | Disposition: A | Discharge status: Home / Self Care | Payer: PPO | Source: Ambulatory Visit | Attending: Family Medicine | Admitting: Family Medicine

## 2020-02-20 ENCOUNTER — Other Ambulatory Visit: Payer: Self-pay

## 2020-02-20 ENCOUNTER — Ambulatory Visit: Payer: PPO

## 2020-02-20 DIAGNOSIS — Z780 Asymptomatic menopausal state: Secondary | ICD-10-CM | POA: Diagnosis not present

## 2020-02-20 DIAGNOSIS — M8588 Other specified disorders of bone density and structure, other site: Secondary | ICD-10-CM

## 2020-02-20 DIAGNOSIS — M85852 Other specified disorders of bone density and structure, left thigh: Secondary | ICD-10-CM | POA: Diagnosis not present

## 2020-03-17 ENCOUNTER — Other Ambulatory Visit: Payer: Self-pay

## 2020-03-17 ENCOUNTER — Ambulatory Visit
Admission: RE | Admit: 2020-03-17 | Discharge: 2020-03-17 | Disposition: A | Discharge status: Home / Self Care | Payer: PPO | Source: Ambulatory Visit | Attending: Family Medicine | Admitting: Family Medicine

## 2020-03-17 DIAGNOSIS — J309 Allergic rhinitis, unspecified: Secondary | ICD-10-CM | POA: Diagnosis not present

## 2020-03-17 DIAGNOSIS — K219 Gastro-esophageal reflux disease without esophagitis: Secondary | ICD-10-CM | POA: Diagnosis not present

## 2020-03-17 DIAGNOSIS — R7309 Other abnormal glucose: Secondary | ICD-10-CM | POA: Diagnosis not present

## 2020-03-17 DIAGNOSIS — H4050X0 Glaucoma secondary to other eye disorders, unspecified eye, stage unspecified: Secondary | ICD-10-CM | POA: Diagnosis not present

## 2020-03-17 DIAGNOSIS — E782 Mixed hyperlipidemia: Secondary | ICD-10-CM | POA: Diagnosis not present

## 2020-03-17 DIAGNOSIS — S83203D Other tear of unspecified meniscus, current injury, right knee, subsequent encounter: Secondary | ICD-10-CM | POA: Diagnosis not present

## 2020-03-17 DIAGNOSIS — I10 Essential (primary) hypertension: Secondary | ICD-10-CM | POA: Diagnosis not present

## 2020-03-17 DIAGNOSIS — G4700 Insomnia, unspecified: Secondary | ICD-10-CM | POA: Diagnosis not present

## 2020-03-17 DIAGNOSIS — H9190 Unspecified hearing loss, unspecified ear: Secondary | ICD-10-CM | POA: Diagnosis not present

## 2020-03-17 DIAGNOSIS — N952 Postmenopausal atrophic vaginitis: Secondary | ICD-10-CM | POA: Diagnosis not present

## 2020-03-17 DIAGNOSIS — Z1231 Encounter for screening mammogram for malignant neoplasm of breast: Secondary | ICD-10-CM

## 2020-03-17 DIAGNOSIS — E559 Vitamin D deficiency, unspecified: Secondary | ICD-10-CM | POA: Diagnosis not present

## 2020-03-17 DIAGNOSIS — R6882 Decreased libido: Secondary | ICD-10-CM | POA: Diagnosis not present

## 2020-03-26 DIAGNOSIS — J309 Allergic rhinitis, unspecified: Secondary | ICD-10-CM | POA: Diagnosis not present

## 2020-03-26 DIAGNOSIS — I10 Essential (primary) hypertension: Secondary | ICD-10-CM | POA: Diagnosis not present

## 2020-03-26 DIAGNOSIS — E559 Vitamin D deficiency, unspecified: Secondary | ICD-10-CM | POA: Diagnosis not present

## 2020-03-26 DIAGNOSIS — R6882 Decreased libido: Secondary | ICD-10-CM | POA: Diagnosis not present

## 2020-03-26 DIAGNOSIS — N952 Postmenopausal atrophic vaginitis: Secondary | ICD-10-CM | POA: Diagnosis not present

## 2020-03-26 DIAGNOSIS — K219 Gastro-esophageal reflux disease without esophagitis: Secondary | ICD-10-CM | POA: Diagnosis not present

## 2020-03-26 DIAGNOSIS — G4700 Insomnia, unspecified: Secondary | ICD-10-CM | POA: Diagnosis not present

## 2020-03-26 DIAGNOSIS — E782 Mixed hyperlipidemia: Secondary | ICD-10-CM | POA: Diagnosis not present

## 2020-03-26 DIAGNOSIS — H4050X0 Glaucoma secondary to other eye disorders, unspecified eye, stage unspecified: Secondary | ICD-10-CM | POA: Diagnosis not present

## 2020-03-26 DIAGNOSIS — R7309 Other abnormal glucose: Secondary | ICD-10-CM | POA: Diagnosis not present

## 2020-04-15 DIAGNOSIS — D2271 Melanocytic nevi of right lower limb, including hip: Secondary | ICD-10-CM | POA: Diagnosis not present

## 2020-04-15 DIAGNOSIS — L821 Other seborrheic keratosis: Secondary | ICD-10-CM | POA: Diagnosis not present

## 2020-04-15 DIAGNOSIS — D2261 Melanocytic nevi of right upper limb, including shoulder: Secondary | ICD-10-CM | POA: Diagnosis not present

## 2020-04-15 DIAGNOSIS — D2262 Melanocytic nevi of left upper limb, including shoulder: Secondary | ICD-10-CM | POA: Diagnosis not present

## 2020-04-15 DIAGNOSIS — L820 Inflamed seborrheic keratosis: Secondary | ICD-10-CM | POA: Diagnosis not present

## 2020-04-15 DIAGNOSIS — D225 Melanocytic nevi of trunk: Secondary | ICD-10-CM | POA: Diagnosis not present

## 2020-04-15 DIAGNOSIS — D2272 Melanocytic nevi of left lower limb, including hip: Secondary | ICD-10-CM | POA: Diagnosis not present

## 2020-04-15 DIAGNOSIS — Z85820 Personal history of malignant melanoma of skin: Secondary | ICD-10-CM | POA: Diagnosis not present

## 2020-04-15 DIAGNOSIS — L814 Other melanin hyperpigmentation: Secondary | ICD-10-CM | POA: Diagnosis not present

## 2020-04-15 DIAGNOSIS — D485 Neoplasm of uncertain behavior of skin: Secondary | ICD-10-CM | POA: Diagnosis not present

## 2020-05-20 DIAGNOSIS — D2261 Melanocytic nevi of right upper limb, including shoulder: Secondary | ICD-10-CM | POA: Diagnosis not present

## 2020-05-20 DIAGNOSIS — D485 Neoplasm of uncertain behavior of skin: Secondary | ICD-10-CM | POA: Diagnosis not present

## 2020-05-20 DIAGNOSIS — Z85820 Personal history of malignant melanoma of skin: Secondary | ICD-10-CM | POA: Diagnosis not present

## 2020-06-16 DIAGNOSIS — J019 Acute sinusitis, unspecified: Secondary | ICD-10-CM | POA: Diagnosis not present

## 2020-07-02 DIAGNOSIS — H25813 Combined forms of age-related cataract, bilateral: Secondary | ICD-10-CM | POA: Diagnosis not present

## 2020-07-02 DIAGNOSIS — H40023 Open angle with borderline findings, high risk, bilateral: Secondary | ICD-10-CM | POA: Diagnosis not present

## 2020-07-02 DIAGNOSIS — H18463 Peripheral corneal degeneration, bilateral: Secondary | ICD-10-CM | POA: Diagnosis not present

## 2020-07-24 IMAGING — US US PELVIS COMPLETE TRANSABD/TRANSVAG
1 series · 13 of 25 positions shown · non-contrast
Comparison: CT 10/23/2017

CLINICAL DATA: Postmenopausal bleeding and spotting

EXAM:
TRANSABDOMINAL AND TRANSVAGINAL ULTRASOUND OF PELVIS
TECHNIQUE: Both transabdominal and transvaginal ultrasound examinations of the
pelvis were performed. Transabdominal technique was performed for
global imaging of the pelvis including uterus, ovaries, adnexal
regions, and pelvic cul-de-sac. It was necessary to proceed with
endovaginal exam following the transabdominal exam to visualize the
uterus endometrium and adnexa.

[Series 1: us pelvis complete transabd/transvag · 0.25mm/px · 13 of 32 slices shown]
[im 1/32]
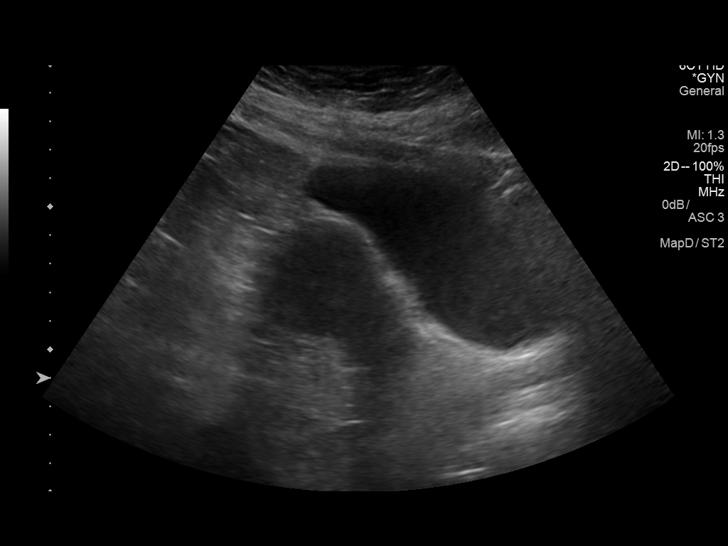
[im 3/32]
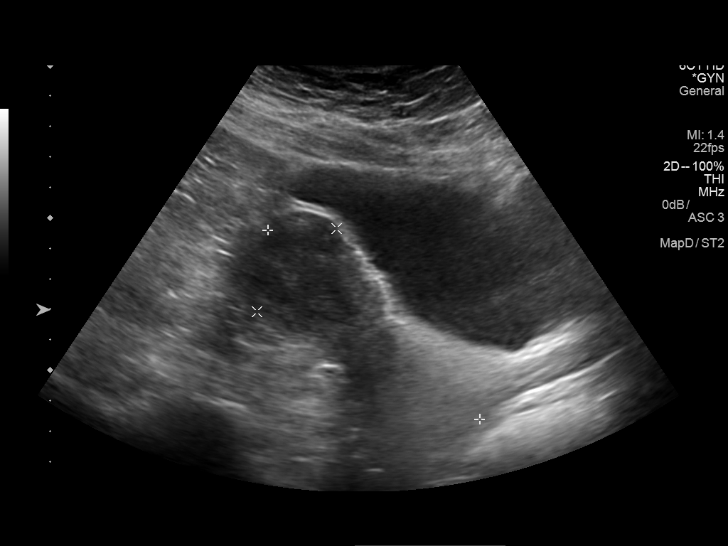
[im 6/32]
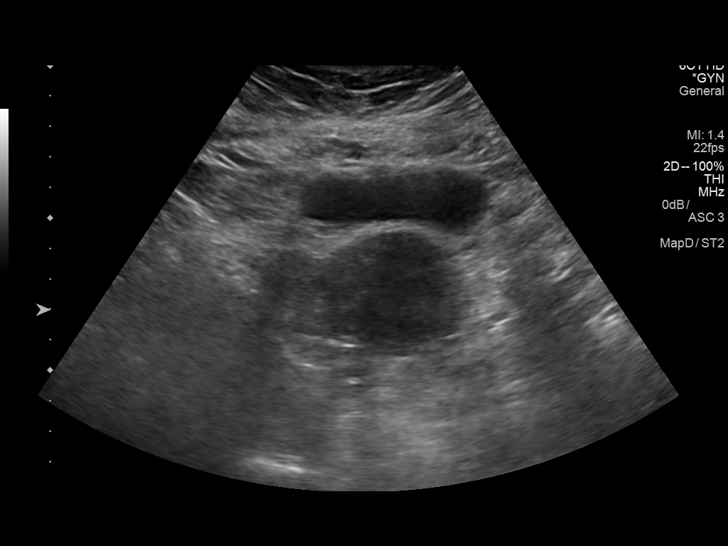
[im 8/32]
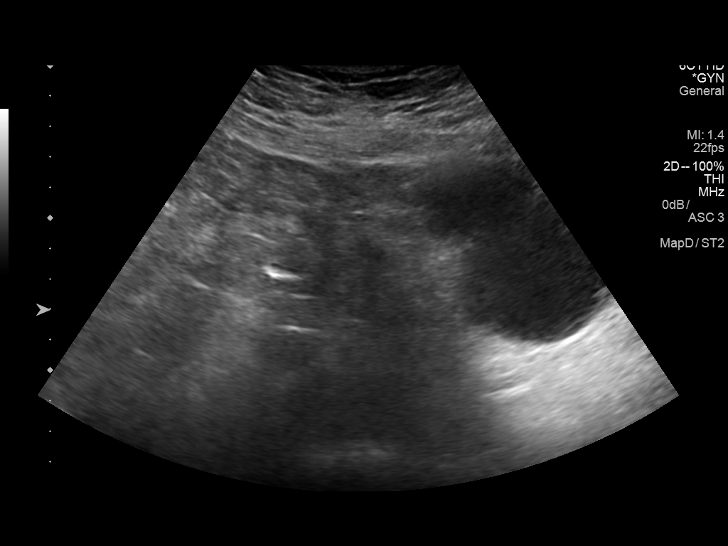
[im 11/32]
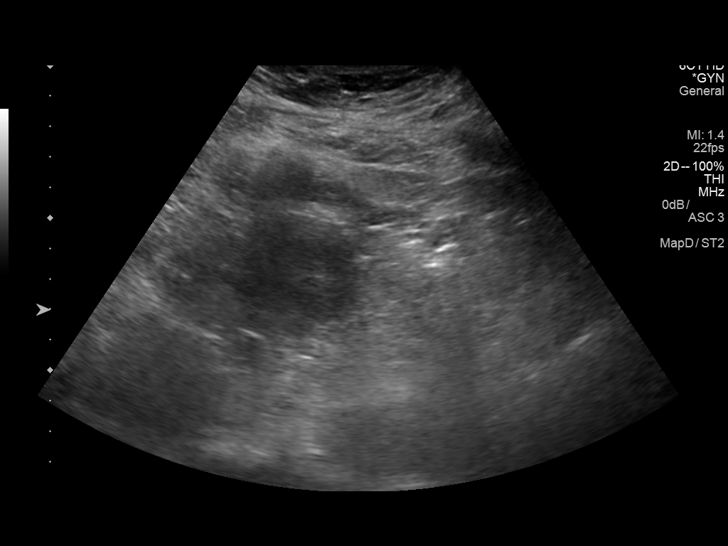
[im 13/32]
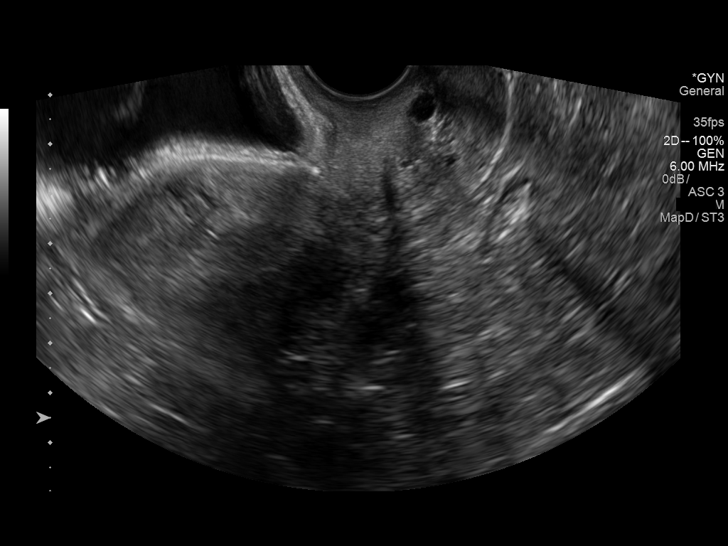
[im 16/32]
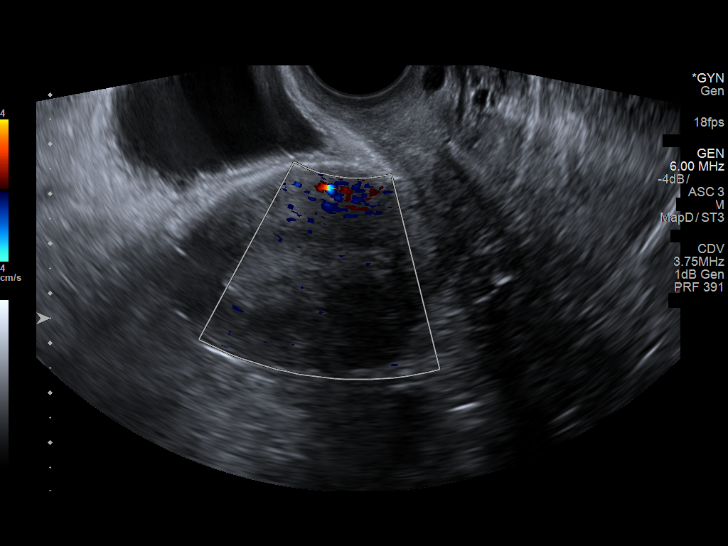
[im 19/32]
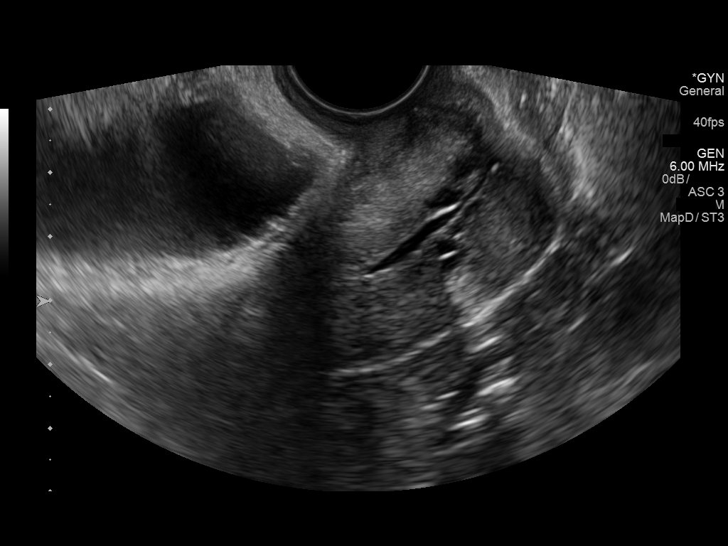
[im 21/32]
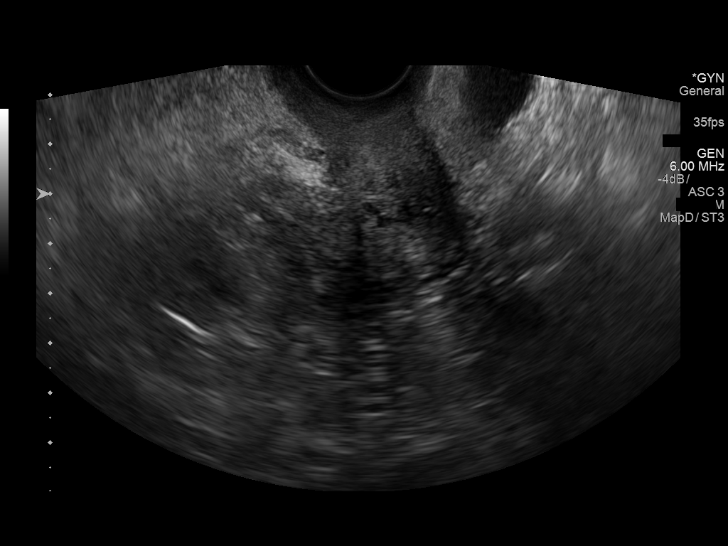
[im 24/32]
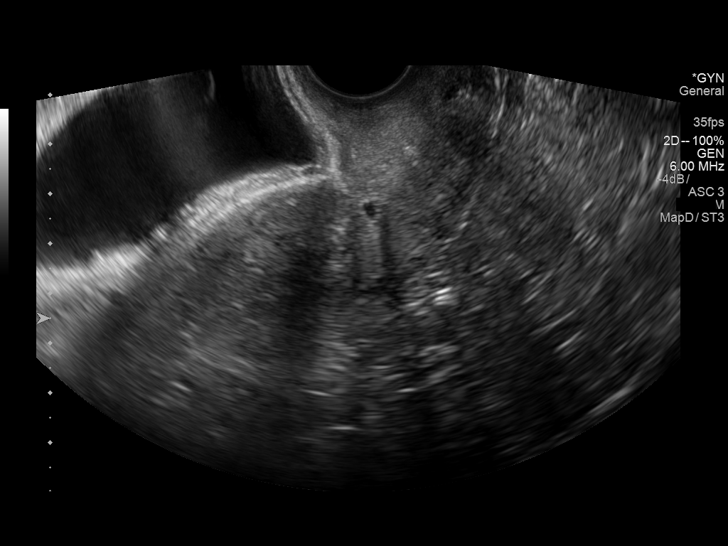
[im 26/32]
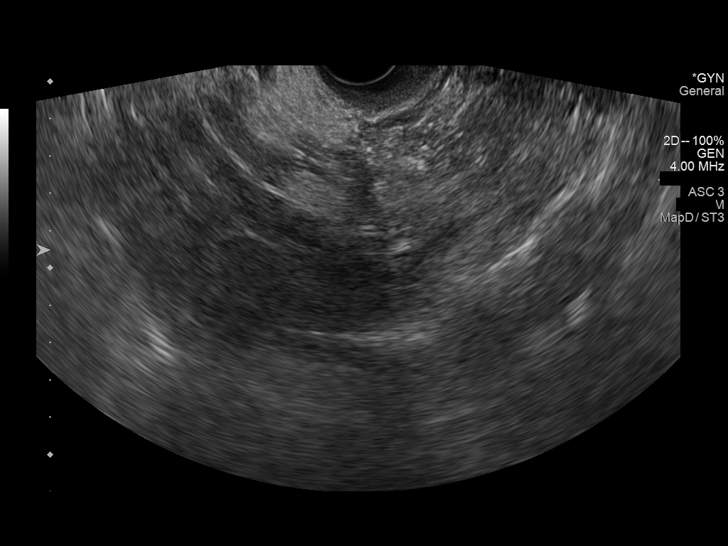
[im 29/32]
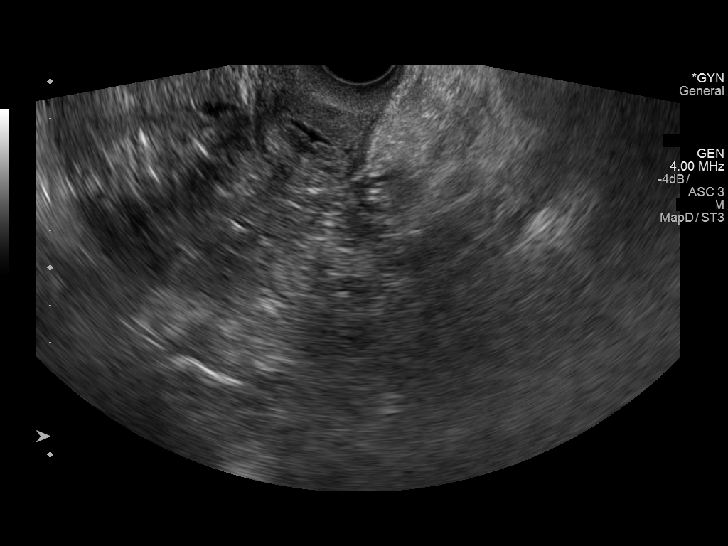
[im 32/32]
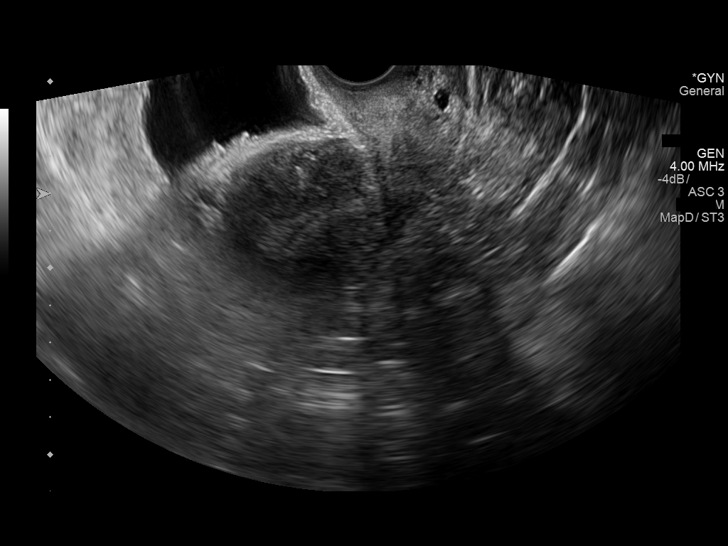

[13 of 25 positions shown; findings below may reference images not displayed]

FINDINGS: Uterus

Measurements: 8.9 x 4.2 x 5.9 cm. No fibroids or other mass
visualized.

Endometrium

Thickness: 11.3 mm.  No focal abnormality visualized.

Right ovary

Nonvisualized

Left ovary

Surgically absent by report

Other findings

No abnormal free fluid.
IMPRESSION: 1. Endometrial thickness of 11.3 mm. In the setting of
post-menopausal bleeding, endometrial sampling is indicated to
exclude carcinoma. If results are benign, sonohysterogram should be
considered for focal lesion work-up. (Ref: Radiological Reasoning:
Algorithmic Workup of Abnormal Vaginal Bleeding with Endovaginal
Sonography and Sonohysterography. AJR [id number]; 191:[id number])
2. Surgical absence left ovary.  Nonvisualized right ovary.

## 2020-08-11 DIAGNOSIS — E782 Mixed hyperlipidemia: Secondary | ICD-10-CM | POA: Diagnosis not present

## 2020-08-11 DIAGNOSIS — G4700 Insomnia, unspecified: Secondary | ICD-10-CM | POA: Diagnosis not present

## 2020-08-11 DIAGNOSIS — I10 Essential (primary) hypertension: Secondary | ICD-10-CM | POA: Diagnosis not present

## 2020-08-11 DIAGNOSIS — K219 Gastro-esophageal reflux disease without esophagitis: Secondary | ICD-10-CM | POA: Diagnosis not present

## 2020-08-11 DIAGNOSIS — H4050X0 Glaucoma secondary to other eye disorders, unspecified eye, stage unspecified: Secondary | ICD-10-CM | POA: Diagnosis not present

## 2020-08-11 DIAGNOSIS — M1990 Unspecified osteoarthritis, unspecified site: Secondary | ICD-10-CM | POA: Diagnosis not present

## 2020-09-09 DIAGNOSIS — H2511 Age-related nuclear cataract, right eye: Secondary | ICD-10-CM | POA: Diagnosis not present

## 2020-09-29 DIAGNOSIS — J309 Allergic rhinitis, unspecified: Secondary | ICD-10-CM | POA: Diagnosis not present

## 2020-09-29 DIAGNOSIS — N952 Postmenopausal atrophic vaginitis: Secondary | ICD-10-CM | POA: Diagnosis not present

## 2020-09-29 DIAGNOSIS — I10 Essential (primary) hypertension: Secondary | ICD-10-CM | POA: Diagnosis not present

## 2020-09-29 DIAGNOSIS — K219 Gastro-esophageal reflux disease without esophagitis: Secondary | ICD-10-CM | POA: Diagnosis not present

## 2020-09-29 DIAGNOSIS — H4050X0 Glaucoma secondary to other eye disorders, unspecified eye, stage unspecified: Secondary | ICD-10-CM | POA: Diagnosis not present

## 2020-09-29 DIAGNOSIS — E782 Mixed hyperlipidemia: Secondary | ICD-10-CM | POA: Diagnosis not present

## 2020-09-29 DIAGNOSIS — G4700 Insomnia, unspecified: Secondary | ICD-10-CM | POA: Diagnosis not present

## 2020-09-29 DIAGNOSIS — R6882 Decreased libido: Secondary | ICD-10-CM | POA: Diagnosis not present

## 2020-09-29 DIAGNOSIS — E559 Vitamin D deficiency, unspecified: Secondary | ICD-10-CM | POA: Diagnosis not present

## 2020-09-29 DIAGNOSIS — R7309 Other abnormal glucose: Secondary | ICD-10-CM | POA: Diagnosis not present

## 2020-10-08 DIAGNOSIS — E782 Mixed hyperlipidemia: Secondary | ICD-10-CM | POA: Diagnosis not present

## 2020-10-08 DIAGNOSIS — J309 Allergic rhinitis, unspecified: Secondary | ICD-10-CM | POA: Diagnosis not present

## 2020-10-08 DIAGNOSIS — N952 Postmenopausal atrophic vaginitis: Secondary | ICD-10-CM | POA: Diagnosis not present

## 2020-10-08 DIAGNOSIS — I10 Essential (primary) hypertension: Secondary | ICD-10-CM | POA: Diagnosis not present

## 2020-10-08 DIAGNOSIS — H4050X0 Glaucoma secondary to other eye disorders, unspecified eye, stage unspecified: Secondary | ICD-10-CM | POA: Diagnosis not present

## 2020-10-08 DIAGNOSIS — R7301 Impaired fasting glucose: Secondary | ICD-10-CM | POA: Diagnosis not present

## 2020-10-08 DIAGNOSIS — E559 Vitamin D deficiency, unspecified: Secondary | ICD-10-CM | POA: Diagnosis not present

## 2020-10-08 DIAGNOSIS — G4700 Insomnia, unspecified: Secondary | ICD-10-CM | POA: Diagnosis not present

## 2020-10-11 ENCOUNTER — Other Ambulatory Visit: Payer: Self-pay | Admitting: Family Medicine

## 2020-10-11 DIAGNOSIS — E782 Mixed hyperlipidemia: Secondary | ICD-10-CM

## 2020-12-02 DIAGNOSIS — Z23 Encounter for immunization: Secondary | ICD-10-CM | POA: Diagnosis not present

## 2020-12-02 DIAGNOSIS — E782 Mixed hyperlipidemia: Secondary | ICD-10-CM | POA: Diagnosis not present

## 2020-12-02 DIAGNOSIS — Z0000 Encounter for general adult medical examination without abnormal findings: Secondary | ICD-10-CM | POA: Diagnosis not present

## 2020-12-02 DIAGNOSIS — N952 Postmenopausal atrophic vaginitis: Secondary | ICD-10-CM | POA: Diagnosis not present

## 2020-12-02 DIAGNOSIS — E559 Vitamin D deficiency, unspecified: Secondary | ICD-10-CM | POA: Diagnosis not present

## 2020-12-02 DIAGNOSIS — I10 Essential (primary) hypertension: Secondary | ICD-10-CM | POA: Diagnosis not present

## 2020-12-02 DIAGNOSIS — G4700 Insomnia, unspecified: Secondary | ICD-10-CM | POA: Diagnosis not present

## 2020-12-02 DIAGNOSIS — H4050X0 Glaucoma secondary to other eye disorders, unspecified eye, stage unspecified: Secondary | ICD-10-CM | POA: Diagnosis not present

## 2020-12-02 DIAGNOSIS — Z1389 Encounter for screening for other disorder: Secondary | ICD-10-CM | POA: Diagnosis not present

## 2020-12-02 DIAGNOSIS — J309 Allergic rhinitis, unspecified: Secondary | ICD-10-CM | POA: Diagnosis not present

## 2020-12-06 ENCOUNTER — Ambulatory Visit
Admission: RE | Admit: 2020-12-06 | Discharge: 2020-12-06 | Disposition: A | Discharge status: Home / Self Care | Payer: No Typology Code available for payment source | Source: Ambulatory Visit | Attending: Family Medicine | Admitting: Family Medicine

## 2020-12-06 ENCOUNTER — Other Ambulatory Visit: Payer: Self-pay

## 2020-12-06 DIAGNOSIS — E782 Mixed hyperlipidemia: Secondary | ICD-10-CM

## 2020-12-16 DIAGNOSIS — H9202 Otalgia, left ear: Secondary | ICD-10-CM | POA: Diagnosis not present

## 2020-12-16 DIAGNOSIS — H6692 Otitis media, unspecified, left ear: Secondary | ICD-10-CM | POA: Diagnosis not present

## 2021-01-03 DIAGNOSIS — D485 Neoplasm of uncertain behavior of skin: Secondary | ICD-10-CM | POA: Diagnosis not present

## 2021-01-03 DIAGNOSIS — Z85820 Personal history of malignant melanoma of skin: Secondary | ICD-10-CM | POA: Diagnosis not present

## 2021-01-03 DIAGNOSIS — D2272 Melanocytic nevi of left lower limb, including hip: Secondary | ICD-10-CM | POA: Diagnosis not present

## 2021-01-03 DIAGNOSIS — D225 Melanocytic nevi of trunk: Secondary | ICD-10-CM | POA: Diagnosis not present

## 2021-01-03 DIAGNOSIS — D2361 Other benign neoplasm of skin of right upper limb, including shoulder: Secondary | ICD-10-CM | POA: Diagnosis not present

## 2021-01-03 DIAGNOSIS — D0461 Carcinoma in situ of skin of right upper limb, including shoulder: Secondary | ICD-10-CM | POA: Diagnosis not present

## 2021-01-03 DIAGNOSIS — L821 Other seborrheic keratosis: Secondary | ICD-10-CM | POA: Diagnosis not present

## 2021-01-03 DIAGNOSIS — D2271 Melanocytic nevi of right lower limb, including hip: Secondary | ICD-10-CM | POA: Diagnosis not present

## 2021-02-10 DIAGNOSIS — K219 Gastro-esophageal reflux disease without esophagitis: Secondary | ICD-10-CM | POA: Diagnosis not present

## 2021-02-10 DIAGNOSIS — I10 Essential (primary) hypertension: Secondary | ICD-10-CM | POA: Diagnosis not present

## 2021-02-10 DIAGNOSIS — M1990 Unspecified osteoarthritis, unspecified site: Secondary | ICD-10-CM | POA: Diagnosis not present

## 2021-02-10 DIAGNOSIS — E782 Mixed hyperlipidemia: Secondary | ICD-10-CM | POA: Diagnosis not present

## 2021-02-10 DIAGNOSIS — G4700 Insomnia, unspecified: Secondary | ICD-10-CM | POA: Diagnosis not present

## 2021-02-10 DIAGNOSIS — H4050X0 Glaucoma secondary to other eye disorders, unspecified eye, stage unspecified: Secondary | ICD-10-CM | POA: Diagnosis not present

## 2021-02-24 DIAGNOSIS — Z85820 Personal history of malignant melanoma of skin: Secondary | ICD-10-CM | POA: Diagnosis not present

## 2021-02-24 DIAGNOSIS — L988 Other specified disorders of the skin and subcutaneous tissue: Secondary | ICD-10-CM | POA: Diagnosis not present

## 2021-02-24 DIAGNOSIS — D485 Neoplasm of uncertain behavior of skin: Secondary | ICD-10-CM | POA: Diagnosis not present

## 2021-03-03 ENCOUNTER — Other Ambulatory Visit: Payer: Self-pay | Admitting: Family Medicine

## 2021-03-03 DIAGNOSIS — Z1231 Encounter for screening mammogram for malignant neoplasm of breast: Secondary | ICD-10-CM

## 2021-04-07 ENCOUNTER — Ambulatory Visit
Admission: RE | Admit: 2021-04-07 | Discharge: 2021-04-07 | Disposition: A | Discharge status: Home / Self Care | Payer: PPO | Source: Ambulatory Visit | Attending: Family Medicine | Admitting: Family Medicine

## 2021-04-07 DIAGNOSIS — Z1231 Encounter for screening mammogram for malignant neoplasm of breast: Secondary | ICD-10-CM | POA: Diagnosis not present

## 2021-04-14 DIAGNOSIS — M8588 Other specified disorders of bone density and structure, other site: Secondary | ICD-10-CM | POA: Diagnosis not present

## 2021-04-14 DIAGNOSIS — H9201 Otalgia, right ear: Secondary | ICD-10-CM | POA: Diagnosis not present

## 2021-04-14 DIAGNOSIS — G4700 Insomnia, unspecified: Secondary | ICD-10-CM | POA: Diagnosis not present

## 2021-04-14 DIAGNOSIS — G729 Myopathy, unspecified: Secondary | ICD-10-CM | POA: Diagnosis not present

## 2021-04-14 DIAGNOSIS — J309 Allergic rhinitis, unspecified: Secondary | ICD-10-CM | POA: Diagnosis not present

## 2021-04-14 DIAGNOSIS — I10 Essential (primary) hypertension: Secondary | ICD-10-CM | POA: Diagnosis not present

## 2021-04-14 DIAGNOSIS — H6123 Impacted cerumen, bilateral: Secondary | ICD-10-CM | POA: Diagnosis not present

## 2021-04-14 DIAGNOSIS — E782 Mixed hyperlipidemia: Secondary | ICD-10-CM | POA: Diagnosis not present

## 2021-04-14 DIAGNOSIS — K120 Recurrent oral aphthae: Secondary | ICD-10-CM | POA: Diagnosis not present

## 2021-04-14 DIAGNOSIS — I700 Atherosclerosis of aorta: Secondary | ICD-10-CM | POA: Diagnosis not present

## 2021-04-14 DIAGNOSIS — N952 Postmenopausal atrophic vaginitis: Secondary | ICD-10-CM | POA: Diagnosis not present

## 2021-04-14 DIAGNOSIS — K219 Gastro-esophageal reflux disease without esophagitis: Secondary | ICD-10-CM | POA: Diagnosis not present

## 2021-05-06 DIAGNOSIS — B001 Herpesviral vesicular dermatitis: Secondary | ICD-10-CM | POA: Diagnosis not present

## 2021-05-18 DIAGNOSIS — K130 Diseases of lips: Secondary | ICD-10-CM | POA: Diagnosis not present

## 2021-05-18 DIAGNOSIS — Z85820 Personal history of malignant melanoma of skin: Secondary | ICD-10-CM | POA: Diagnosis not present

## 2021-05-20 ENCOUNTER — Other Ambulatory Visit (HOSPITAL_COMMUNITY): Payer: Self-pay | Admitting: Family Medicine

## 2021-05-20 ENCOUNTER — Ambulatory Visit (HOSPITAL_COMMUNITY)
Admission: RE | Admit: 2021-05-20 | Discharge: 2021-05-20 | Disposition: A | Discharge status: Home / Self Care | Payer: PPO | Source: Ambulatory Visit | Attending: Family Medicine | Admitting: Family Medicine

## 2021-05-20 ENCOUNTER — Other Ambulatory Visit: Payer: Self-pay | Admitting: Family Medicine

## 2021-05-20 DIAGNOSIS — M7989 Other specified soft tissue disorders: Secondary | ICD-10-CM | POA: Diagnosis not present

## 2021-05-20 DIAGNOSIS — M79651 Pain in right thigh: Secondary | ICD-10-CM | POA: Diagnosis not present

## 2021-05-20 DIAGNOSIS — M79604 Pain in right leg: Secondary | ICD-10-CM | POA: Diagnosis not present

## 2021-05-20 DIAGNOSIS — J0140 Acute pansinusitis, unspecified: Secondary | ICD-10-CM | POA: Diagnosis not present

## 2021-05-20 NOTE — Progress Notes (Signed)
Lower extremity venous RT study completed.   Please see CV Proc for preliminary results.   Rekha Hobbins, RDMS, RVT  

## 2021-07-22 DIAGNOSIS — H25812 Combined forms of age-related cataract, left eye: Secondary | ICD-10-CM | POA: Diagnosis not present

## 2021-07-22 DIAGNOSIS — H18463 Peripheral corneal degeneration, bilateral: Secondary | ICD-10-CM | POA: Diagnosis not present

## 2021-07-22 DIAGNOSIS — H40023 Open angle with borderline findings, high risk, bilateral: Secondary | ICD-10-CM | POA: Diagnosis not present

## 2021-07-22 DIAGNOSIS — H16042 Marginal corneal ulcer, left eye: Secondary | ICD-10-CM | POA: Diagnosis not present

## 2021-07-22 DIAGNOSIS — Z961 Presence of intraocular lens: Secondary | ICD-10-CM | POA: Diagnosis not present

## 2021-07-25 DIAGNOSIS — H18463 Peripheral corneal degeneration, bilateral: Secondary | ICD-10-CM | POA: Diagnosis not present

## 2021-07-25 DIAGNOSIS — Z961 Presence of intraocular lens: Secondary | ICD-10-CM | POA: Diagnosis not present

## 2021-07-25 DIAGNOSIS — H25812 Combined forms of age-related cataract, left eye: Secondary | ICD-10-CM | POA: Diagnosis not present

## 2021-07-25 DIAGNOSIS — H16042 Marginal corneal ulcer, left eye: Secondary | ICD-10-CM | POA: Diagnosis not present

## 2021-08-01 DIAGNOSIS — H25812 Combined forms of age-related cataract, left eye: Secondary | ICD-10-CM | POA: Diagnosis not present

## 2021-08-01 DIAGNOSIS — Z961 Presence of intraocular lens: Secondary | ICD-10-CM | POA: Diagnosis not present

## 2021-08-01 DIAGNOSIS — H16042 Marginal corneal ulcer, left eye: Secondary | ICD-10-CM | POA: Diagnosis not present

## 2021-08-01 DIAGNOSIS — H18463 Peripheral corneal degeneration, bilateral: Secondary | ICD-10-CM | POA: Diagnosis not present

## 2021-08-05 DIAGNOSIS — H189 Unspecified disorder of cornea: Secondary | ICD-10-CM | POA: Diagnosis not present

## 2021-08-05 DIAGNOSIS — B009 Herpesviral infection, unspecified: Secondary | ICD-10-CM | POA: Diagnosis not present

## 2021-08-05 DIAGNOSIS — I10 Essential (primary) hypertension: Secondary | ICD-10-CM | POA: Diagnosis not present

## 2021-08-05 DIAGNOSIS — B370 Candidal stomatitis: Secondary | ICD-10-CM | POA: Diagnosis not present

## 2021-08-31 DIAGNOSIS — H18463 Peripheral corneal degeneration, bilateral: Secondary | ICD-10-CM | POA: Diagnosis not present

## 2021-08-31 DIAGNOSIS — H25812 Combined forms of age-related cataract, left eye: Secondary | ICD-10-CM | POA: Diagnosis not present

## 2021-08-31 DIAGNOSIS — H16042 Marginal corneal ulcer, left eye: Secondary | ICD-10-CM | POA: Diagnosis not present

## 2021-08-31 DIAGNOSIS — H16223 Keratoconjunctivitis sicca, not specified as Sjogren's, bilateral: Secondary | ICD-10-CM | POA: Diagnosis not present

## 2021-10-05 DIAGNOSIS — Z961 Presence of intraocular lens: Secondary | ICD-10-CM | POA: Diagnosis not present

## 2021-10-05 DIAGNOSIS — H18463 Peripheral corneal degeneration, bilateral: Secondary | ICD-10-CM | POA: Diagnosis not present

## 2021-10-05 DIAGNOSIS — H16223 Keratoconjunctivitis sicca, not specified as Sjogren's, bilateral: Secondary | ICD-10-CM | POA: Diagnosis not present

## 2021-10-05 DIAGNOSIS — H40023 Open angle with borderline findings, high risk, bilateral: Secondary | ICD-10-CM | POA: Diagnosis not present

## 2021-10-05 DIAGNOSIS — H16042 Marginal corneal ulcer, left eye: Secondary | ICD-10-CM | POA: Diagnosis not present

## 2021-10-05 DIAGNOSIS — H25812 Combined forms of age-related cataract, left eye: Secondary | ICD-10-CM | POA: Diagnosis not present

## 2021-10-07 DIAGNOSIS — E782 Mixed hyperlipidemia: Secondary | ICD-10-CM | POA: Diagnosis not present

## 2021-10-07 DIAGNOSIS — E559 Vitamin D deficiency, unspecified: Secondary | ICD-10-CM | POA: Diagnosis not present

## 2021-10-12 DIAGNOSIS — H04123 Dry eye syndrome of bilateral lacrimal glands: Secondary | ICD-10-CM | POA: Diagnosis not present

## 2021-10-12 DIAGNOSIS — N952 Postmenopausal atrophic vaginitis: Secondary | ICD-10-CM | POA: Diagnosis not present

## 2021-10-12 DIAGNOSIS — R7309 Other abnormal glucose: Secondary | ICD-10-CM | POA: Diagnosis not present

## 2021-10-12 DIAGNOSIS — R7303 Prediabetes: Secondary | ICD-10-CM | POA: Diagnosis not present

## 2021-10-12 DIAGNOSIS — E782 Mixed hyperlipidemia: Secondary | ICD-10-CM | POA: Diagnosis not present

## 2021-10-12 DIAGNOSIS — H4050X0 Glaucoma secondary to other eye disorders, unspecified eye, stage unspecified: Secondary | ICD-10-CM | POA: Diagnosis not present

## 2021-10-12 DIAGNOSIS — G4700 Insomnia, unspecified: Secondary | ICD-10-CM | POA: Diagnosis not present

## 2021-10-12 DIAGNOSIS — I10 Essential (primary) hypertension: Secondary | ICD-10-CM | POA: Diagnosis not present

## 2021-10-12 DIAGNOSIS — E559 Vitamin D deficiency, unspecified: Secondary | ICD-10-CM | POA: Diagnosis not present

## 2021-12-05 DIAGNOSIS — H2512 Age-related nuclear cataract, left eye: Secondary | ICD-10-CM | POA: Diagnosis not present

## 2021-12-06 ENCOUNTER — Other Ambulatory Visit: Payer: Self-pay | Admitting: Family Medicine

## 2021-12-06 DIAGNOSIS — Z0000 Encounter for general adult medical examination without abnormal findings: Secondary | ICD-10-CM | POA: Diagnosis not present

## 2021-12-06 DIAGNOSIS — Z1331 Encounter for screening for depression: Secondary | ICD-10-CM | POA: Diagnosis not present

## 2021-12-06 DIAGNOSIS — M8588 Other specified disorders of bone density and structure, other site: Secondary | ICD-10-CM | POA: Diagnosis not present

## 2021-12-06 DIAGNOSIS — N952 Postmenopausal atrophic vaginitis: Secondary | ICD-10-CM | POA: Diagnosis not present

## 2021-12-06 DIAGNOSIS — I10 Essential (primary) hypertension: Secondary | ICD-10-CM | POA: Diagnosis not present

## 2021-12-06 DIAGNOSIS — Z6829 Body mass index (BMI) 29.0-29.9, adult: Secondary | ICD-10-CM | POA: Diagnosis not present

## 2021-12-06 DIAGNOSIS — R7309 Other abnormal glucose: Secondary | ICD-10-CM | POA: Diagnosis not present

## 2021-12-06 DIAGNOSIS — Z01419 Encounter for gynecological examination (general) (routine) without abnormal findings: Secondary | ICD-10-CM | POA: Diagnosis not present

## 2021-12-06 DIAGNOSIS — E782 Mixed hyperlipidemia: Secondary | ICD-10-CM | POA: Diagnosis not present

## 2021-12-08 DIAGNOSIS — H268 Other specified cataract: Secondary | ICD-10-CM | POA: Diagnosis not present

## 2021-12-08 DIAGNOSIS — H25812 Combined forms of age-related cataract, left eye: Secondary | ICD-10-CM | POA: Diagnosis not present

## 2022-01-04 ENCOUNTER — Other Ambulatory Visit: Payer: Self-pay | Admitting: Family Medicine

## 2022-01-04 DIAGNOSIS — Z1231 Encounter for screening mammogram for malignant neoplasm of breast: Secondary | ICD-10-CM

## 2022-01-11 DIAGNOSIS — L570 Actinic keratosis: Secondary | ICD-10-CM | POA: Diagnosis not present

## 2022-01-11 DIAGNOSIS — D225 Melanocytic nevi of trunk: Secondary | ICD-10-CM | POA: Diagnosis not present

## 2022-01-11 DIAGNOSIS — Z85820 Personal history of malignant melanoma of skin: Secondary | ICD-10-CM | POA: Diagnosis not present

## 2022-01-11 DIAGNOSIS — K130 Diseases of lips: Secondary | ICD-10-CM | POA: Diagnosis not present

## 2022-01-11 DIAGNOSIS — L821 Other seborrheic keratosis: Secondary | ICD-10-CM | POA: Diagnosis not present

## 2022-01-25 DIAGNOSIS — M7062 Trochanteric bursitis, left hip: Secondary | ICD-10-CM | POA: Diagnosis not present

## 2022-01-25 DIAGNOSIS — Z6828 Body mass index (BMI) 28.0-28.9, adult: Secondary | ICD-10-CM | POA: Diagnosis not present

## 2022-01-26 DIAGNOSIS — M25552 Pain in left hip: Secondary | ICD-10-CM | POA: Diagnosis not present

## 2022-01-26 DIAGNOSIS — M25561 Pain in right knee: Secondary | ICD-10-CM | POA: Diagnosis not present

## 2022-02-16 DIAGNOSIS — U071 COVID-19: Secondary | ICD-10-CM | POA: Diagnosis not present

## 2022-02-16 DIAGNOSIS — L600 Ingrowing nail: Secondary | ICD-10-CM | POA: Diagnosis not present

## 2022-02-27 DIAGNOSIS — L600 Ingrowing nail: Secondary | ICD-10-CM | POA: Diagnosis not present

## 2022-02-27 DIAGNOSIS — L03031 Cellulitis of right toe: Secondary | ICD-10-CM | POA: Diagnosis not present

## 2022-02-27 DIAGNOSIS — M2041 Other hammer toe(s) (acquired), right foot: Secondary | ICD-10-CM | POA: Diagnosis not present

## 2022-02-27 DIAGNOSIS — M2011 Hallux valgus (acquired), right foot: Secondary | ICD-10-CM | POA: Diagnosis not present

## 2022-03-01 ENCOUNTER — Ambulatory Visit: Payer: PPO | Admitting: Podiatry

## 2022-03-15 DIAGNOSIS — L03031 Cellulitis of right toe: Secondary | ICD-10-CM | POA: Diagnosis not present

## 2022-04-07 DIAGNOSIS — R7309 Other abnormal glucose: Secondary | ICD-10-CM | POA: Diagnosis not present

## 2022-04-07 DIAGNOSIS — E782 Mixed hyperlipidemia: Secondary | ICD-10-CM | POA: Diagnosis not present

## 2022-04-13 DIAGNOSIS — H4050X0 Glaucoma secondary to other eye disorders, unspecified eye, stage unspecified: Secondary | ICD-10-CM | POA: Diagnosis not present

## 2022-04-13 DIAGNOSIS — Z09 Encounter for follow-up examination after completed treatment for conditions other than malignant neoplasm: Secondary | ICD-10-CM | POA: Diagnosis not present

## 2022-04-13 DIAGNOSIS — R7301 Impaired fasting glucose: Secondary | ICD-10-CM | POA: Diagnosis not present

## 2022-04-13 DIAGNOSIS — Z6828 Body mass index (BMI) 28.0-28.9, adult: Secondary | ICD-10-CM | POA: Diagnosis not present

## 2022-04-13 DIAGNOSIS — G4700 Insomnia, unspecified: Secondary | ICD-10-CM | POA: Diagnosis not present

## 2022-04-13 DIAGNOSIS — H04123 Dry eye syndrome of bilateral lacrimal glands: Secondary | ICD-10-CM | POA: Diagnosis not present

## 2022-04-13 DIAGNOSIS — N952 Postmenopausal atrophic vaginitis: Secondary | ICD-10-CM | POA: Diagnosis not present

## 2022-04-13 DIAGNOSIS — E782 Mixed hyperlipidemia: Secondary | ICD-10-CM | POA: Diagnosis not present

## 2022-04-13 DIAGNOSIS — I10 Essential (primary) hypertension: Secondary | ICD-10-CM | POA: Diagnosis not present

## 2022-04-13 DIAGNOSIS — E559 Vitamin D deficiency, unspecified: Secondary | ICD-10-CM | POA: Diagnosis not present

## 2022-04-13 DIAGNOSIS — Z23 Encounter for immunization: Secondary | ICD-10-CM | POA: Diagnosis not present

## 2022-04-22 ENCOUNTER — Emergency Department (HOSPITAL_BASED_OUTPATIENT_CLINIC_OR_DEPARTMENT_OTHER)
Admission: EM | Admit: 2022-04-22 | Discharge: 2022-04-22 | Disposition: A | Discharge status: Home / Self Care | Payer: PPO | Attending: Emergency Medicine | Admitting: Emergency Medicine

## 2022-04-22 ENCOUNTER — Encounter (HOSPITAL_BASED_OUTPATIENT_CLINIC_OR_DEPARTMENT_OTHER): Payer: Self-pay

## 2022-04-22 DIAGNOSIS — Z1152 Encounter for screening for COVID-19: Secondary | ICD-10-CM | POA: Insufficient documentation

## 2022-04-22 DIAGNOSIS — J029 Acute pharyngitis, unspecified: Secondary | ICD-10-CM

## 2022-04-22 DIAGNOSIS — J069 Acute upper respiratory infection, unspecified: Secondary | ICD-10-CM | POA: Insufficient documentation

## 2022-04-22 LAB — RESP PANEL BY RT-PCR (RSV, FLU A&B, COVID)  RVPGX2
Influenza A by PCR: NEGATIVE
Influenza B by PCR: NEGATIVE
Resp Syncytial Virus by PCR: NEGATIVE
SARS Coronavirus 2 by RT PCR: NEGATIVE

## 2022-04-22 LAB — GROUP A STREP BY PCR: Group A Strep by PCR: NOT DETECTED

## 2022-04-22 NOTE — ED Provider Notes (Signed)
[facility] EMERGENCY DEPT Provider Note   CSN: [id number] Arrival date & time: 04/22/22  1058     History  Chief Complaint  Patient presents with   Sore Throat    Maria Hurst is a 76 y.o. female.  Patient presents to the emergency department today for evaluation of sore throat.  This started 3 days ago.  She has also had some mild nasal congestion and a tickle in her throat.  She has had a sparse cough.  No fevers, ear pain.  She denies chest pain or shortness of breath.  No arm or back pain.  No lower extremity swelling.  No nausea, vomiting, or diarrhea.  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       Home Medications Prior to Admission medications   Medication Sig Start Date End Date Taking? Authorizing Provider  acetaminophen (TYLENOL) 325 MG tablet Take 650 mg by mouth every 6 (six) hours as needed.    [provider]  amoxicillin (AMOXIL) 500 MG capsule Take 500 mg by mouth 4 (four) times daily.    [provider]  aspirin EC 81 MG tablet Take 81 mg by mouth daily.    [provider]  ezetimibe (ZETIA) 10 MG tablet Take 1 tablet (10 mg total) by mouth daily after supper. 07/04/19 10/02/19  Adrian Prows, MD  losartan (COZAAR) 25 MG tablet Take 1 tablet (25 mg total) by mouth daily after supper. 07/04/19 10/02/19  Adrian Prows, MD  rosuvastatin (CRESTOR) 10 MG tablet Take 1 tablet by mouth at bedtime. 06/13/19   [provider]  timolol (TIMOPTIC-XR) 0.5 % ophthalmic gel-forming Place 1 drop into both eyes daily.  06/01/19   [provider]  Vitamin D, Ergocalciferol, (DRISDOL) 1.25 MG (50000 UNIT) CAPS capsule Take 50,000 Units by mouth every 7 (seven) days.    [provider]  zolpidem (AMBIEN) 5 MG tablet Take 5 mg by mouth at bedtime as needed for sleep.    [provider]      Allergies    Sulfa antibiotics    Review of Systems   Review of Systems  Physical  Exam Updated Vital Signs BP (!) 164/82 (BP Location: Right Arm)   Pulse 68   Temp 98.3 F (36.8 C)   Resp 16   Ht 5' 3.5" (1.613 m)   Wt 69.9 kg   SpO2 100%   BMI 26.85 kg/m  Physical Exam Vitals and nursing note reviewed.  Constitutional:      Appearance: She is well-developed.  HENT:     Head: Normocephalic and atraumatic.     Jaw: No trismus.     Right Ear: Tympanic membrane, ear canal and external ear normal.     Left Ear: Tympanic membrane, ear canal and external ear normal.     Nose: Congestion present. No mucosal edema or rhinorrhea.     Mouth/Throat:     Mouth: Mucous membranes are moist. Mucous membranes are not dry. No oral lesions.     Pharynx: Uvula midline. Posterior oropharyngeal erythema present. No oropharyngeal exudate or uvula swelling.     Tonsils: No tonsillar abscesses.  Eyes:     General:        Right eye: No discharge.        Left eye: No discharge.     Conjunctiva/sclera: Conjunctivae normal.  Cardiovascular:     Rate and Rhythm: Normal rate and regular rhythm.     Heart  sounds: Normal heart sounds.  Pulmonary:     Effort: Pulmonary effort is normal. No respiratory distress.     Breath sounds: Normal breath sounds. No wheezing or rales.     Comments: Lungs clear to auscultation bilaterally. Abdominal:     Palpations: Abdomen is soft.     Tenderness: There is no abdominal tenderness.  Musculoskeletal:     Cervical back: Normal range of motion and neck supple.  Lymphadenopathy:     Cervical: No cervical adenopathy.  Skin:    General: Skin is warm and dry.  Neurological:     Mental Status: She is alert.  Psychiatric:        Mood and Affect: Mood normal.     ED Results / Procedures / Treatments   Labs (all labs ordered are listed, but only abnormal results are displayed) Labs Reviewed  GROUP A STREP BY PCR  RESP PANEL BY RT-PCR (RSV, FLU A&B, COVID)  [id number]    EKG None  Radiology No results found.  Procedures Procedures     Medications Ordered in ED Medications - No data to display  ED Course/ Medical Decision Making/ A&P    Patient seen and examined. History obtained directly from patient. Work-up including labs, imaging, EKG ordered in triage, if performed, were reviewed.    Labs/EKG: Independently reviewed and interpreted.  This included: Respiratory panel, negative for flu and COVID, strep test negative.  Imaging: None ordered  Medications/Fluids: None ordered  Most recent vital signs reviewed and are as follows: BP (!) 164/82 (BP Location: Right Arm)   Pulse 68   Temp 98.3 F (36.8 C)   Resp 16   Ht 5' 3.5" (1.613 m)   Wt 69.9 kg   SpO2 100%   BMI 26.85 kg/m   Initial impression: URI with sore throat  Home treatment plan: Continued symptomatic control with OTC meds  Return instructions discussed with patient: Worsening difficulty swallowing, chest pain or shortness of breath   Follow-up instructions discussed with patient: follow-up with PCP in 5 days if not improving                          Medical Decision Making  Patient with symptoms consistent with a viral syndrome. Vitals are stable, no fever.  Flu, COVID, RSV, and strep are negative.  No signs of dehydration. Lung exam normal, no signs of pneumonia. Supportive therapy indicated with return if symptoms worsen.  Considered ACS, PE given risk factors, however no anterior chest pain, shortness of breath and this is unlikely to be an anginal equivalent.         Final Clinical Impression(s) / ED Diagnoses Final diagnoses:  Upper respiratory tract infection, unspecified type  Sore throat    Rx / DC Orders ED Discharge Orders     None         Carlisle Cater, PA-C 04/22/22 1402    Charlesetta Shanks, MD 04/24/22 1411

## 2022-04-22 NOTE — ED Notes (Signed)
Patient verbalizes understanding of discharge instructions. Opportunity for questioning and answers were provided. Patient discharged from ED.  °

## 2022-04-22 NOTE — ED Triage Notes (Signed)
Pt c/o sore throat x 2 days. 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

## 2022-04-22 NOTE — Discharge Instructions (Signed)
Please read and follow all provided instructions.  Your diagnoses today include:  1. Upper respiratory tract infection, unspecified type   2. Sore throat     You appear to have an upper respiratory infection (URI). An upper respiratory tract infection, or cold, is a viral infection of the air passages leading to the lungs. It should improve gradually after 5-7 days. You may have a lingering cough that lasts for 2- 4 weeks after the infection.  Tests performed today include: Vital signs. See below for your results today.  Strep test: was negative for strep throat Flu, COVID, RSV testing: Was negative  Medications prescribed:  None prescribed  Take any prescribed medications only as directed. Treatment for your infection is aimed at treating the symptoms. There are no medications, such as antibiotics, that will cure your infection.   Home care instructions:  You can take Tylenol and/or Ibuprofen as directed on the packaging for fever reduction and pain relief.    For cough: honey 1/2 to 1 teaspoon (you can dilute the honey in water or another fluid).  You can also use guaifenesin and dextromethorphan for cough. You can use a humidifier for chest congestion and cough.  If you don't have a humidifier, you can sit in the bathroom with the hot shower running.      For sore throat: try warm salt water gargles, cepacol lozenges, throat spray, warm tea or water with lemon/honey, popsicles or ice, or OTC cold relief medicine for throat discomfort.    For congestion: take a daily anti-histamine like Zyrtec, Claritin, and a oral decongestant, such as pseudoephedrine.  You can also use Flonase 1-2 sprays in each nostril daily.    It is important to stay hydrated: drink plenty of fluids (water, gatorade/powerade/pedialyte, juices, or teas) to keep your throat moisturized and help further relieve irritation/discomfort.   Your illness is contagious and can be spread to others, especially during the  first 3 or 4 days. It cannot be cured by antibiotics or other medicines. Take basic precautions such as washing your hands often, covering your mouth when you cough or sneeze, and avoiding public places where you could spread your illness to others.   Please continue drinking plenty of fluids.  Use over-the-counter medicines as needed as directed on packaging for symptom relief.  You may also use ibuprofen or tylenol as directed on packaging for pain or fever.  Do not take multiple medicines containing Tylenol or acetaminophen to avoid taking too much of this medication.  Follow-up instructions: Please follow-up with your primary care provider in the next 3 days for further evaluation of your symptoms if you are not feeling better.   Return instructions:  Please return to the Emergency Department if you experience worsening symptoms.  RETURN IMMEDIATELY IF you develop shortness of breath, confusion or altered mental status, a new rash, become dizzy, faint, or poorly responsive, or are unable to be cared for at home. Please return if you have persistent vomiting and cannot keep down fluids or develop a fever that is not controlled by tylenol or motrin.   Please return if you have any other emergent concerns.  Additional Information:  Your vital signs today were: BP (!) 164/82 (BP Location: Right Arm)   Pulse 68   Temp 98.3 F (36.8 C)   Resp 16   Ht 5' 3.5" (1.613 m)   Wt 69.9 kg   SpO2 100%   BMI 26.85 kg/m  If your blood pressure (BP) was elevated  above 135/85 this visit, please have this repeated by your doctor within one month. --------------

## 2022-04-25 DIAGNOSIS — J0141 Acute recurrent pansinusitis: Secondary | ICD-10-CM | POA: Diagnosis not present

## 2022-04-25 DIAGNOSIS — B349 Viral infection, unspecified: Secondary | ICD-10-CM | POA: Diagnosis not present

## 2022-04-27 DIAGNOSIS — H16041 Marginal corneal ulcer, right eye: Secondary | ICD-10-CM | POA: Diagnosis not present

## 2022-05-05 DIAGNOSIS — H18463 Peripheral corneal degeneration, bilateral: Secondary | ICD-10-CM | POA: Diagnosis not present

## 2022-05-05 DIAGNOSIS — Z961 Presence of intraocular lens: Secondary | ICD-10-CM | POA: Diagnosis not present

## 2022-05-05 DIAGNOSIS — H16041 Marginal corneal ulcer, right eye: Secondary | ICD-10-CM | POA: Diagnosis not present

## 2022-05-05 DIAGNOSIS — H40023 Open angle with borderline findings, high risk, bilateral: Secondary | ICD-10-CM | POA: Diagnosis not present

## 2022-05-05 DIAGNOSIS — H16042 Marginal corneal ulcer, left eye: Secondary | ICD-10-CM | POA: Diagnosis not present

## 2022-06-16 ENCOUNTER — Ambulatory Visit
Admission: RE | Admit: 2022-06-16 | Discharge: 2022-06-16 | Disposition: A | Discharge status: Home / Self Care | Payer: PPO | Source: Ambulatory Visit | Attending: Family Medicine | Admitting: Family Medicine

## 2022-06-16 DIAGNOSIS — Z780 Asymptomatic menopausal state: Secondary | ICD-10-CM | POA: Diagnosis not present

## 2022-06-16 DIAGNOSIS — M8589 Other specified disorders of bone density and structure, multiple sites: Secondary | ICD-10-CM | POA: Diagnosis not present

## 2022-06-16 DIAGNOSIS — M8588 Other specified disorders of bone density and structure, other site: Secondary | ICD-10-CM

## 2022-06-16 DIAGNOSIS — Z1231 Encounter for screening mammogram for malignant neoplasm of breast: Secondary | ICD-10-CM

## 2022-07-05 DIAGNOSIS — G720 Drug-induced myopathy: Secondary | ICD-10-CM | POA: Diagnosis not present

## 2022-07-05 DIAGNOSIS — M85852 Other specified disorders of bone density and structure, left thigh: Secondary | ICD-10-CM | POA: Diagnosis not present

## 2022-07-05 DIAGNOSIS — E559 Vitamin D deficiency, unspecified: Secondary | ICD-10-CM | POA: Diagnosis not present

## 2022-07-05 DIAGNOSIS — R7301 Impaired fasting glucose: Secondary | ICD-10-CM | POA: Diagnosis not present

## 2022-07-05 DIAGNOSIS — I700 Atherosclerosis of aorta: Secondary | ICD-10-CM | POA: Diagnosis not present

## 2022-07-05 DIAGNOSIS — Z6828 Body mass index (BMI) 28.0-28.9, adult: Secondary | ICD-10-CM | POA: Diagnosis not present

## 2022-07-20 DIAGNOSIS — M25562 Pain in left knee: Secondary | ICD-10-CM | POA: Diagnosis not present

## 2022-07-20 DIAGNOSIS — M1711 Unilateral primary osteoarthritis, right knee: Secondary | ICD-10-CM | POA: Diagnosis not present

## 2022-10-31 DIAGNOSIS — H40023 Open angle with borderline findings, high risk, bilateral: Secondary | ICD-10-CM | POA: Diagnosis not present

## 2022-10-31 DIAGNOSIS — H18463 Peripheral corneal degeneration, bilateral: Secondary | ICD-10-CM | POA: Diagnosis not present

## 2022-10-31 DIAGNOSIS — H16043 Marginal corneal ulcer, bilateral: Secondary | ICD-10-CM | POA: Diagnosis not present

## 2022-10-31 DIAGNOSIS — Z961 Presence of intraocular lens: Secondary | ICD-10-CM | POA: Diagnosis not present

## 2022-11-06 DIAGNOSIS — B3731 Acute candidiasis of vulva and vagina: Secondary | ICD-10-CM | POA: Diagnosis not present

## 2022-11-06 DIAGNOSIS — Z6828 Body mass index (BMI) 28.0-28.9, adult: Secondary | ICD-10-CM | POA: Diagnosis not present

## 2022-11-06 DIAGNOSIS — R350 Frequency of micturition: Secondary | ICD-10-CM | POA: Diagnosis not present

## 2022-11-08 DIAGNOSIS — L905 Scar conditions and fibrosis of skin: Secondary | ICD-10-CM | POA: Diagnosis not present

## 2022-11-08 DIAGNOSIS — D485 Neoplasm of uncertain behavior of skin: Secondary | ICD-10-CM | POA: Diagnosis not present

## 2022-11-08 DIAGNOSIS — Z85820 Personal history of malignant melanoma of skin: Secondary | ICD-10-CM | POA: Diagnosis not present

## 2022-11-16 DIAGNOSIS — N952 Postmenopausal atrophic vaginitis: Secondary | ICD-10-CM | POA: Diagnosis not present

## 2022-11-16 DIAGNOSIS — H4050X0 Glaucoma secondary to other eye disorders, unspecified eye, stage unspecified: Secondary | ICD-10-CM | POA: Diagnosis not present

## 2022-11-16 DIAGNOSIS — I10 Essential (primary) hypertension: Secondary | ICD-10-CM | POA: Diagnosis not present

## 2022-11-16 DIAGNOSIS — E559 Vitamin D deficiency, unspecified: Secondary | ICD-10-CM | POA: Diagnosis not present

## 2022-11-16 DIAGNOSIS — M85852 Other specified disorders of bone density and structure, left thigh: Secondary | ICD-10-CM | POA: Diagnosis not present

## 2022-11-16 DIAGNOSIS — Z6828 Body mass index (BMI) 28.0-28.9, adult: Secondary | ICD-10-CM | POA: Diagnosis not present

## 2022-11-16 DIAGNOSIS — M8588 Other specified disorders of bone density and structure, other site: Secondary | ICD-10-CM | POA: Diagnosis not present

## 2022-11-16 DIAGNOSIS — G4700 Insomnia, unspecified: Secondary | ICD-10-CM | POA: Diagnosis not present

## 2022-11-16 DIAGNOSIS — E782 Mixed hyperlipidemia: Secondary | ICD-10-CM | POA: Diagnosis not present

## 2022-11-16 DIAGNOSIS — R7301 Impaired fasting glucose: Secondary | ICD-10-CM | POA: Diagnosis not present

## 2022-11-16 DIAGNOSIS — Z23 Encounter for immunization: Secondary | ICD-10-CM | POA: Diagnosis not present

## 2022-11-16 DIAGNOSIS — I700 Atherosclerosis of aorta: Secondary | ICD-10-CM | POA: Diagnosis not present

## 2023-01-15 DIAGNOSIS — Z0000 Encounter for general adult medical examination without abnormal findings: Secondary | ICD-10-CM | POA: Diagnosis not present

## 2023-01-15 DIAGNOSIS — Z6829 Body mass index (BMI) 29.0-29.9, adult: Secondary | ICD-10-CM | POA: Diagnosis not present

## 2023-01-15 DIAGNOSIS — Z23 Encounter for immunization: Secondary | ICD-10-CM | POA: Diagnosis not present

## 2023-01-17 DIAGNOSIS — D2261 Melanocytic nevi of right upper limb, including shoulder: Secondary | ICD-10-CM | POA: Diagnosis not present

## 2023-01-17 DIAGNOSIS — D225 Melanocytic nevi of trunk: Secondary | ICD-10-CM | POA: Diagnosis not present

## 2023-01-17 DIAGNOSIS — L821 Other seborrheic keratosis: Secondary | ICD-10-CM | POA: Diagnosis not present

## 2023-01-17 DIAGNOSIS — Z85820 Personal history of malignant melanoma of skin: Secondary | ICD-10-CM | POA: Diagnosis not present

## 2023-01-17 DIAGNOSIS — D2262 Melanocytic nevi of left upper limb, including shoulder: Secondary | ICD-10-CM | POA: Diagnosis not present

## 2023-01-17 DIAGNOSIS — L820 Inflamed seborrheic keratosis: Secondary | ICD-10-CM | POA: Diagnosis not present

## 2023-01-17 DIAGNOSIS — L570 Actinic keratosis: Secondary | ICD-10-CM | POA: Diagnosis not present

## 2023-01-23 DIAGNOSIS — M1712 Unilateral primary osteoarthritis, left knee: Secondary | ICD-10-CM | POA: Diagnosis not present

## 2023-01-23 DIAGNOSIS — M1711 Unilateral primary osteoarthritis, right knee: Secondary | ICD-10-CM | POA: Diagnosis not present

## 2023-02-20 DIAGNOSIS — M25562 Pain in left knee: Secondary | ICD-10-CM | POA: Diagnosis not present

## 2023-02-20 DIAGNOSIS — M79605 Pain in left leg: Secondary | ICD-10-CM | POA: Diagnosis not present

## 2023-02-20 DIAGNOSIS — M25561 Pain in right knee: Secondary | ICD-10-CM | POA: Diagnosis not present

## 2023-02-20 DIAGNOSIS — M79604 Pain in right leg: Secondary | ICD-10-CM | POA: Diagnosis not present

## 2023-03-05 DIAGNOSIS — M25562 Pain in left knee: Secondary | ICD-10-CM | POA: Diagnosis not present

## 2023-03-05 DIAGNOSIS — M25561 Pain in right knee: Secondary | ICD-10-CM | POA: Diagnosis not present

## 2023-03-05 DIAGNOSIS — M79604 Pain in right leg: Secondary | ICD-10-CM | POA: Diagnosis not present

## 2023-03-05 DIAGNOSIS — M79605 Pain in left leg: Secondary | ICD-10-CM | POA: Diagnosis not present

## 2023-03-07 DIAGNOSIS — M79604 Pain in right leg: Secondary | ICD-10-CM | POA: Diagnosis not present

## 2023-03-07 DIAGNOSIS — M25561 Pain in right knee: Secondary | ICD-10-CM | POA: Diagnosis not present

## 2023-03-07 DIAGNOSIS — M79605 Pain in left leg: Secondary | ICD-10-CM | POA: Diagnosis not present

## 2023-03-07 DIAGNOSIS — M25562 Pain in left knee: Secondary | ICD-10-CM | POA: Diagnosis not present

## 2023-03-12 DIAGNOSIS — M25561 Pain in right knee: Secondary | ICD-10-CM | POA: Diagnosis not present

## 2023-03-12 DIAGNOSIS — M25562 Pain in left knee: Secondary | ICD-10-CM | POA: Diagnosis not present

## 2023-03-12 DIAGNOSIS — M79604 Pain in right leg: Secondary | ICD-10-CM | POA: Diagnosis not present

## 2023-03-12 DIAGNOSIS — M79605 Pain in left leg: Secondary | ICD-10-CM | POA: Diagnosis not present

## 2023-03-20 DIAGNOSIS — M170 Bilateral primary osteoarthritis of knee: Secondary | ICD-10-CM | POA: Diagnosis not present

## 2023-03-29 DIAGNOSIS — M25561 Pain in right knee: Secondary | ICD-10-CM | POA: Diagnosis not present

## 2023-03-29 DIAGNOSIS — M25562 Pain in left knee: Secondary | ICD-10-CM | POA: Diagnosis not present

## 2023-08-02 DIAGNOSIS — M25561 Pain in right knee: Secondary | ICD-10-CM | POA: Diagnosis not present

## 2023-08-02 DIAGNOSIS — M25562 Pain in left knee: Secondary | ICD-10-CM | POA: Diagnosis not present

## 2023-08-02 DIAGNOSIS — M79605 Pain in left leg: Secondary | ICD-10-CM | POA: Diagnosis not present

## 2023-08-02 DIAGNOSIS — M79604 Pain in right leg: Secondary | ICD-10-CM | POA: Diagnosis not present

## 2023-09-23 DIAGNOSIS — L259 Unspecified contact dermatitis, unspecified cause: Secondary | ICD-10-CM | POA: Diagnosis not present

## 2023-11-08 DIAGNOSIS — D2272 Melanocytic nevi of left lower limb, including hip: Secondary | ICD-10-CM | POA: Diagnosis not present

## 2023-11-08 DIAGNOSIS — L821 Other seborrheic keratosis: Secondary | ICD-10-CM | POA: Diagnosis not present

## 2023-11-08 DIAGNOSIS — Z85820 Personal history of malignant melanoma of skin: Secondary | ICD-10-CM | POA: Diagnosis not present

## 2023-11-08 DIAGNOSIS — D2271 Melanocytic nevi of right lower limb, including hip: Secondary | ICD-10-CM | POA: Diagnosis not present

## 2023-11-08 DIAGNOSIS — D2261 Melanocytic nevi of right upper limb, including shoulder: Secondary | ICD-10-CM | POA: Diagnosis not present

## 2023-11-08 DIAGNOSIS — D2262 Melanocytic nevi of left upper limb, including shoulder: Secondary | ICD-10-CM | POA: Diagnosis not present

## 2023-11-08 DIAGNOSIS — L820 Inflamed seborrheic keratosis: Secondary | ICD-10-CM | POA: Diagnosis not present

## 2023-11-08 DIAGNOSIS — L720 Epidermal cyst: Secondary | ICD-10-CM | POA: Diagnosis not present

## 2023-11-08 DIAGNOSIS — L905 Scar conditions and fibrosis of skin: Secondary | ICD-10-CM | POA: Diagnosis not present

## 2023-11-08 DIAGNOSIS — D225 Melanocytic nevi of trunk: Secondary | ICD-10-CM | POA: Diagnosis not present

## 2023-11-12 DIAGNOSIS — Z961 Presence of intraocular lens: Secondary | ICD-10-CM | POA: Diagnosis not present

## 2023-11-12 DIAGNOSIS — H18463 Peripheral corneal degeneration, bilateral: Secondary | ICD-10-CM | POA: Diagnosis not present

## 2023-11-12 DIAGNOSIS — H16041 Marginal corneal ulcer, right eye: Secondary | ICD-10-CM | POA: Diagnosis not present

## 2023-11-12 DIAGNOSIS — H40023 Open angle with borderline findings, high risk, bilateral: Secondary | ICD-10-CM | POA: Diagnosis not present

## 2024-01-10 DIAGNOSIS — M170 Bilateral primary osteoarthritis of knee: Secondary | ICD-10-CM | POA: Diagnosis not present

## 2024-01-10 DIAGNOSIS — M25562 Pain in left knee: Secondary | ICD-10-CM | POA: Diagnosis not present

## 2024-01-10 DIAGNOSIS — M25561 Pain in right knee: Secondary | ICD-10-CM | POA: Diagnosis not present

## 2024-02-06 DIAGNOSIS — M170 Bilateral primary osteoarthritis of knee: Secondary | ICD-10-CM | POA: Diagnosis not present

## 2024-02-14 DIAGNOSIS — G4700 Insomnia, unspecified: Secondary | ICD-10-CM | POA: Diagnosis not present

## 2024-02-14 DIAGNOSIS — E782 Mixed hyperlipidemia: Secondary | ICD-10-CM | POA: Diagnosis not present

## 2024-02-14 DIAGNOSIS — I700 Atherosclerosis of aorta: Secondary | ICD-10-CM | POA: Diagnosis not present

## 2024-02-14 DIAGNOSIS — Z0000 Encounter for general adult medical examination without abnormal findings: Secondary | ICD-10-CM | POA: Diagnosis not present

## 2024-02-14 DIAGNOSIS — H04123 Dry eye syndrome of bilateral lacrimal glands: Secondary | ICD-10-CM | POA: Diagnosis not present

## 2024-02-14 DIAGNOSIS — I10 Essential (primary) hypertension: Secondary | ICD-10-CM | POA: Diagnosis not present

## 2024-02-14 DIAGNOSIS — R7301 Impaired fasting glucose: Secondary | ICD-10-CM | POA: Diagnosis not present

## 2024-02-14 DIAGNOSIS — N952 Postmenopausal atrophic vaginitis: Secondary | ICD-10-CM | POA: Diagnosis not present

## 2024-02-14 DIAGNOSIS — Z1231 Encounter for screening mammogram for malignant neoplasm of breast: Secondary | ICD-10-CM | POA: Diagnosis not present

## 2024-02-14 DIAGNOSIS — Z23 Encounter for immunization: Secondary | ICD-10-CM | POA: Diagnosis not present

## 2024-02-14 DIAGNOSIS — M8588 Other specified disorders of bone density and structure, other site: Secondary | ICD-10-CM | POA: Diagnosis not present

## 2024-02-14 DIAGNOSIS — H4050X0 Glaucoma secondary to other eye disorders, unspecified eye, stage unspecified: Secondary | ICD-10-CM | POA: Diagnosis not present

## 2024-02-14 DIAGNOSIS — R252 Cramp and spasm: Secondary | ICD-10-CM | POA: Diagnosis not present

## 2024-02-16 ENCOUNTER — Other Ambulatory Visit (HOSPITAL_BASED_OUTPATIENT_CLINIC_OR_DEPARTMENT_OTHER): Payer: Self-pay | Admitting: Family Medicine

## 2024-02-16 DIAGNOSIS — M85852 Other specified disorders of bone density and structure, left thigh: Secondary | ICD-10-CM

## 2024-02-21 DIAGNOSIS — Z01419 Encounter for gynecological examination (general) (routine) without abnormal findings: Secondary | ICD-10-CM | POA: Diagnosis not present

## 2024-02-21 DIAGNOSIS — N898 Other specified noninflammatory disorders of vagina: Secondary | ICD-10-CM | POA: Diagnosis not present

## 2024-02-21 DIAGNOSIS — R6882 Decreased libido: Secondary | ICD-10-CM | POA: Diagnosis not present

## 2024-02-21 DIAGNOSIS — Z780 Asymptomatic menopausal state: Secondary | ICD-10-CM | POA: Diagnosis not present

## 2024-03-19 DIAGNOSIS — H18463 Peripheral corneal degeneration, bilateral: Secondary | ICD-10-CM | POA: Diagnosis not present

## 2024-03-19 DIAGNOSIS — Z961 Presence of intraocular lens: Secondary | ICD-10-CM | POA: Diagnosis not present
